# Patient Record
Sex: Male | Born: 1975 | Race: White | Hispanic: No | Marital: Single | Smoking: Current every day smoker
Health system: Southern US, Community
[De-identification: ages and names within clinical notes are randomized; demographics above are authoritative.]

## PROBLEM LIST (undated history)

## (undated) DIAGNOSIS — N200 Calculus of kidney: Secondary | ICD-10-CM

## (undated) DIAGNOSIS — F1990 Other psychoactive substance use, unspecified, uncomplicated: Secondary | ICD-10-CM

## (undated) DIAGNOSIS — M5136 Other intervertebral disc degeneration, lumbar region: Secondary | ICD-10-CM

## (undated) DIAGNOSIS — I82409 Acute embolism and thrombosis of unspecified deep veins of unspecified lower extremity: Secondary | ICD-10-CM

## (undated) DIAGNOSIS — E119 Type 2 diabetes mellitus without complications: Secondary | ICD-10-CM

## (undated) DIAGNOSIS — J449 Chronic obstructive pulmonary disease, unspecified: Secondary | ICD-10-CM

## (undated) DIAGNOSIS — M51369 Other intervertebral disc degeneration, lumbar region without mention of lumbar back pain or lower extremity pain: Secondary | ICD-10-CM

## (undated) DIAGNOSIS — I10 Essential (primary) hypertension: Secondary | ICD-10-CM

## (undated) HISTORY — PX: CHOLECYSTECTOMY: SHX55

## (undated) HISTORY — DX: Chronic obstructive pulmonary disease, unspecified: J44.9

## (undated) HISTORY — PX: BACK SURGERY: SHX140

## (undated) HISTORY — PX: GALLBLADDER SURGERY: SHX652

---

## 1998-10-30 ENCOUNTER — Emergency Department (HOSPITAL_COMMUNITY): Admission: EM | Admit: 1998-10-30 | Discharge: 1998-10-30 | Payer: Self-pay | Admitting: Emergency Medicine

## 2008-01-14 ENCOUNTER — Encounter: Admission: RE | Admit: 2008-01-14 | Discharge: 2008-01-14 | Payer: Self-pay | Admitting: Orthopaedic Surgery

## 2008-01-24 HISTORY — PX: KIDNEY SURGERY: SHX687

## 2010-02-14 ENCOUNTER — Encounter: Payer: Self-pay | Admitting: Orthopaedic Surgery

## 2010-10-11 ENCOUNTER — Other Ambulatory Visit: Payer: Self-pay

## 2010-10-11 ENCOUNTER — Other Ambulatory Visit: Payer: Self-pay | Admitting: Neurology

## 2010-10-11 DIAGNOSIS — M4306 Spondylolysis, lumbar region: Secondary | ICD-10-CM

## 2010-10-11 DIAGNOSIS — M5416 Radiculopathy, lumbar region: Secondary | ICD-10-CM

## 2010-10-14 ENCOUNTER — Ambulatory Visit
Admission: RE | Admit: 2010-10-14 | Discharge: 2010-10-14 | Disposition: A | Discharge status: Home / Self Care | Payer: Medicare Other | Source: Ambulatory Visit | Attending: Neurology | Admitting: Neurology

## 2010-10-14 DIAGNOSIS — M4306 Spondylolysis, lumbar region: Secondary | ICD-10-CM

## 2010-10-14 DIAGNOSIS — M5416 Radiculopathy, lumbar region: Secondary | ICD-10-CM

## 2011-01-26 DIAGNOSIS — M79609 Pain in unspecified limb: Secondary | ICD-10-CM | POA: Diagnosis not present

## 2011-01-26 DIAGNOSIS — B009 Herpesviral infection, unspecified: Secondary | ICD-10-CM | POA: Diagnosis not present

## 2011-01-26 DIAGNOSIS — G4700 Insomnia, unspecified: Secondary | ICD-10-CM | POA: Diagnosis not present

## 2011-01-31 DIAGNOSIS — G4700 Insomnia, unspecified: Secondary | ICD-10-CM | POA: Diagnosis not present

## 2011-01-31 DIAGNOSIS — E669 Obesity, unspecified: Secondary | ICD-10-CM | POA: Diagnosis not present

## 2011-01-31 DIAGNOSIS — M545 Low back pain: Secondary | ICD-10-CM | POA: Diagnosis not present

## 2011-02-06 DIAGNOSIS — G4700 Insomnia, unspecified: Secondary | ICD-10-CM | POA: Diagnosis not present

## 2011-02-06 DIAGNOSIS — M79609 Pain in unspecified limb: Secondary | ICD-10-CM | POA: Diagnosis not present

## 2011-02-06 DIAGNOSIS — L0390 Cellulitis, unspecified: Secondary | ICD-10-CM | POA: Diagnosis not present

## 2011-02-06 DIAGNOSIS — L0291 Cutaneous abscess, unspecified: Secondary | ICD-10-CM | POA: Diagnosis not present

## 2011-02-06 DIAGNOSIS — R609 Edema, unspecified: Secondary | ICD-10-CM | POA: Diagnosis not present

## 2011-02-13 DIAGNOSIS — G4700 Insomnia, unspecified: Secondary | ICD-10-CM | POA: Diagnosis not present

## 2011-02-13 DIAGNOSIS — E669 Obesity, unspecified: Secondary | ICD-10-CM | POA: Diagnosis not present

## 2011-02-13 DIAGNOSIS — M545 Low back pain: Secondary | ICD-10-CM | POA: Diagnosis not present

## 2011-02-22 DIAGNOSIS — I10 Essential (primary) hypertension: Secondary | ICD-10-CM | POA: Diagnosis not present

## 2011-02-22 DIAGNOSIS — M545 Low back pain: Secondary | ICD-10-CM | POA: Diagnosis not present

## 2011-02-22 DIAGNOSIS — R609 Edema, unspecified: Secondary | ICD-10-CM | POA: Diagnosis not present

## 2011-02-22 DIAGNOSIS — G4700 Insomnia, unspecified: Secondary | ICD-10-CM | POA: Diagnosis not present

## 2011-02-23 DIAGNOSIS — F332 Major depressive disorder, recurrent severe without psychotic features: Secondary | ICD-10-CM | POA: Diagnosis not present

## 2011-02-27 DIAGNOSIS — I10 Essential (primary) hypertension: Secondary | ICD-10-CM | POA: Diagnosis not present

## 2011-02-27 DIAGNOSIS — G4700 Insomnia, unspecified: Secondary | ICD-10-CM | POA: Diagnosis not present

## 2011-02-27 DIAGNOSIS — R609 Edema, unspecified: Secondary | ICD-10-CM | POA: Diagnosis not present

## 2011-02-27 DIAGNOSIS — M545 Low back pain: Secondary | ICD-10-CM | POA: Diagnosis not present

## 2011-03-07 DIAGNOSIS — E039 Hypothyroidism, unspecified: Secondary | ICD-10-CM | POA: Diagnosis not present

## 2011-03-07 DIAGNOSIS — R5383 Other fatigue: Secondary | ICD-10-CM | POA: Diagnosis not present

## 2011-03-07 DIAGNOSIS — E7800 Pure hypercholesterolemia, unspecified: Secondary | ICD-10-CM | POA: Diagnosis not present

## 2011-03-07 DIAGNOSIS — Z5181 Encounter for therapeutic drug level monitoring: Secondary | ICD-10-CM | POA: Diagnosis not present

## 2011-03-07 DIAGNOSIS — Z79899 Other long term (current) drug therapy: Secondary | ICD-10-CM | POA: Diagnosis not present

## 2011-03-07 DIAGNOSIS — R5381 Other malaise: Secondary | ICD-10-CM | POA: Diagnosis not present

## 2011-03-07 DIAGNOSIS — E119 Type 2 diabetes mellitus without complications: Secondary | ICD-10-CM | POA: Diagnosis not present

## 2011-03-09 DIAGNOSIS — I10 Essential (primary) hypertension: Secondary | ICD-10-CM | POA: Diagnosis not present

## 2011-03-09 DIAGNOSIS — G4700 Insomnia, unspecified: Secondary | ICD-10-CM | POA: Diagnosis not present

## 2011-03-09 DIAGNOSIS — M545 Low back pain: Secondary | ICD-10-CM | POA: Diagnosis not present

## 2011-03-10 DIAGNOSIS — M47817 Spondylosis without myelopathy or radiculopathy, lumbosacral region: Secondary | ICD-10-CM | POA: Diagnosis not present

## 2011-03-10 DIAGNOSIS — IMO0002 Reserved for concepts with insufficient information to code with codable children: Secondary | ICD-10-CM | POA: Diagnosis not present

## 2011-03-13 DIAGNOSIS — E785 Hyperlipidemia, unspecified: Secondary | ICD-10-CM | POA: Diagnosis not present

## 2011-03-13 DIAGNOSIS — G4700 Insomnia, unspecified: Secondary | ICD-10-CM | POA: Diagnosis not present

## 2011-03-13 DIAGNOSIS — E119 Type 2 diabetes mellitus without complications: Secondary | ICD-10-CM | POA: Diagnosis not present

## 2011-03-13 DIAGNOSIS — Z79899 Other long term (current) drug therapy: Secondary | ICD-10-CM | POA: Diagnosis not present

## 2011-03-13 DIAGNOSIS — Z5181 Encounter for therapeutic drug level monitoring: Secondary | ICD-10-CM | POA: Diagnosis not present

## 2011-03-13 DIAGNOSIS — M545 Low back pain: Secondary | ICD-10-CM | POA: Diagnosis not present

## 2011-03-14 DIAGNOSIS — F339 Major depressive disorder, recurrent, unspecified: Secondary | ICD-10-CM | POA: Diagnosis not present

## 2011-04-11 DIAGNOSIS — M545 Low back pain: Secondary | ICD-10-CM | POA: Diagnosis not present

## 2011-04-11 DIAGNOSIS — R5381 Other malaise: Secondary | ICD-10-CM | POA: Diagnosis not present

## 2011-04-11 DIAGNOSIS — G4700 Insomnia, unspecified: Secondary | ICD-10-CM | POA: Diagnosis not present

## 2011-04-11 DIAGNOSIS — IMO0001 Reserved for inherently not codable concepts without codable children: Secondary | ICD-10-CM | POA: Diagnosis not present

## 2011-04-28 DIAGNOSIS — R5381 Other malaise: Secondary | ICD-10-CM | POA: Diagnosis not present

## 2011-04-28 DIAGNOSIS — G4700 Insomnia, unspecified: Secondary | ICD-10-CM | POA: Diagnosis not present

## 2011-04-28 DIAGNOSIS — M545 Low back pain: Secondary | ICD-10-CM | POA: Diagnosis not present

## 2011-05-05 DIAGNOSIS — M545 Low back pain, unspecified: Secondary | ICD-10-CM | POA: Diagnosis not present

## 2011-05-05 DIAGNOSIS — I10 Essential (primary) hypertension: Secondary | ICD-10-CM | POA: Diagnosis not present

## 2011-05-05 DIAGNOSIS — M5430 Sciatica, unspecified side: Secondary | ICD-10-CM | POA: Diagnosis not present

## 2011-05-07 DIAGNOSIS — R52 Pain, unspecified: Secondary | ICD-10-CM | POA: Diagnosis not present

## 2011-05-07 DIAGNOSIS — M545 Low back pain, unspecified: Secondary | ICD-10-CM | POA: Diagnosis not present

## 2011-05-07 DIAGNOSIS — I10 Essential (primary) hypertension: Secondary | ICD-10-CM | POA: Diagnosis not present

## 2011-05-07 DIAGNOSIS — G8929 Other chronic pain: Secondary | ICD-10-CM | POA: Diagnosis not present

## 2011-05-07 DIAGNOSIS — E119 Type 2 diabetes mellitus without complications: Secondary | ICD-10-CM | POA: Diagnosis not present

## 2011-05-07 DIAGNOSIS — F17200 Nicotine dependence, unspecified, uncomplicated: Secondary | ICD-10-CM | POA: Diagnosis not present

## 2011-05-09 DIAGNOSIS — I10 Essential (primary) hypertension: Secondary | ICD-10-CM | POA: Diagnosis not present

## 2011-05-09 DIAGNOSIS — M545 Low back pain: Secondary | ICD-10-CM | POA: Diagnosis not present

## 2011-05-09 DIAGNOSIS — M79609 Pain in unspecified limb: Secondary | ICD-10-CM | POA: Diagnosis not present

## 2011-05-15 DIAGNOSIS — M9981 Other biomechanical lesions of cervical region: Secondary | ICD-10-CM | POA: Diagnosis not present

## 2011-05-15 DIAGNOSIS — IMO0002 Reserved for concepts with insufficient information to code with codable children: Secondary | ICD-10-CM | POA: Diagnosis not present

## 2011-05-15 DIAGNOSIS — M999 Biomechanical lesion, unspecified: Secondary | ICD-10-CM | POA: Diagnosis not present

## 2011-05-16 DIAGNOSIS — M9981 Other biomechanical lesions of cervical region: Secondary | ICD-10-CM | POA: Diagnosis not present

## 2011-05-16 DIAGNOSIS — M999 Biomechanical lesion, unspecified: Secondary | ICD-10-CM | POA: Diagnosis not present

## 2011-05-16 DIAGNOSIS — IMO0002 Reserved for concepts with insufficient information to code with codable children: Secondary | ICD-10-CM | POA: Diagnosis not present

## 2011-05-17 DIAGNOSIS — Z79899 Other long term (current) drug therapy: Secondary | ICD-10-CM | POA: Diagnosis not present

## 2011-05-17 DIAGNOSIS — R109 Unspecified abdominal pain: Secondary | ICD-10-CM | POA: Diagnosis not present

## 2011-05-17 DIAGNOSIS — F17200 Nicotine dependence, unspecified, uncomplicated: Secondary | ICD-10-CM | POA: Diagnosis not present

## 2011-05-17 DIAGNOSIS — K219 Gastro-esophageal reflux disease without esophagitis: Secondary | ICD-10-CM | POA: Diagnosis not present

## 2011-05-17 DIAGNOSIS — I10 Essential (primary) hypertension: Secondary | ICD-10-CM | POA: Diagnosis not present

## 2011-05-17 DIAGNOSIS — E7800 Pure hypercholesterolemia, unspecified: Secondary | ICD-10-CM | POA: Diagnosis not present

## 2011-05-17 DIAGNOSIS — F329 Major depressive disorder, single episode, unspecified: Secondary | ICD-10-CM | POA: Diagnosis not present

## 2011-05-17 DIAGNOSIS — N201 Calculus of ureter: Secondary | ICD-10-CM | POA: Diagnosis not present

## 2011-05-17 DIAGNOSIS — N209 Urinary calculus, unspecified: Secondary | ICD-10-CM | POA: Diagnosis not present

## 2011-05-18 DIAGNOSIS — IMO0002 Reserved for concepts with insufficient information to code with codable children: Secondary | ICD-10-CM | POA: Diagnosis not present

## 2011-05-18 DIAGNOSIS — M999 Biomechanical lesion, unspecified: Secondary | ICD-10-CM | POA: Diagnosis not present

## 2011-05-18 DIAGNOSIS — M9981 Other biomechanical lesions of cervical region: Secondary | ICD-10-CM | POA: Diagnosis not present

## 2011-05-19 DIAGNOSIS — N200 Calculus of kidney: Secondary | ICD-10-CM | POA: Diagnosis not present

## 2011-05-19 DIAGNOSIS — G4700 Insomnia, unspecified: Secondary | ICD-10-CM | POA: Diagnosis not present

## 2011-05-23 DIAGNOSIS — R5381 Other malaise: Secondary | ICD-10-CM | POA: Diagnosis not present

## 2011-05-23 DIAGNOSIS — R5383 Other fatigue: Secondary | ICD-10-CM | POA: Diagnosis not present

## 2011-05-23 DIAGNOSIS — G4700 Insomnia, unspecified: Secondary | ICD-10-CM | POA: Diagnosis not present

## 2011-05-23 DIAGNOSIS — M79609 Pain in unspecified limb: Secondary | ICD-10-CM | POA: Diagnosis not present

## 2011-05-25 DIAGNOSIS — M999 Biomechanical lesion, unspecified: Secondary | ICD-10-CM | POA: Diagnosis not present

## 2011-05-25 DIAGNOSIS — IMO0002 Reserved for concepts with insufficient information to code with codable children: Secondary | ICD-10-CM | POA: Diagnosis not present

## 2011-05-25 DIAGNOSIS — M9981 Other biomechanical lesions of cervical region: Secondary | ICD-10-CM | POA: Diagnosis not present

## 2011-05-26 DIAGNOSIS — Z5181 Encounter for therapeutic drug level monitoring: Secondary | ICD-10-CM | POA: Diagnosis not present

## 2011-05-26 DIAGNOSIS — Z79899 Other long term (current) drug therapy: Secondary | ICD-10-CM | POA: Diagnosis not present

## 2011-05-26 DIAGNOSIS — G4700 Insomnia, unspecified: Secondary | ICD-10-CM | POA: Diagnosis not present

## 2011-05-26 DIAGNOSIS — M545 Low back pain: Secondary | ICD-10-CM | POA: Diagnosis not present

## 2011-05-30 DIAGNOSIS — M999 Biomechanical lesion, unspecified: Secondary | ICD-10-CM | POA: Diagnosis not present

## 2011-05-30 DIAGNOSIS — M9981 Other biomechanical lesions of cervical region: Secondary | ICD-10-CM | POA: Diagnosis not present

## 2011-05-30 DIAGNOSIS — IMO0002 Reserved for concepts with insufficient information to code with codable children: Secondary | ICD-10-CM | POA: Diagnosis not present

## 2011-06-06 DIAGNOSIS — M9981 Other biomechanical lesions of cervical region: Secondary | ICD-10-CM | POA: Diagnosis not present

## 2011-06-06 DIAGNOSIS — IMO0002 Reserved for concepts with insufficient information to code with codable children: Secondary | ICD-10-CM | POA: Diagnosis not present

## 2011-06-06 DIAGNOSIS — M999 Biomechanical lesion, unspecified: Secondary | ICD-10-CM | POA: Diagnosis not present

## 2011-06-10 DIAGNOSIS — M9981 Other biomechanical lesions of cervical region: Secondary | ICD-10-CM | POA: Diagnosis not present

## 2011-06-10 DIAGNOSIS — M999 Biomechanical lesion, unspecified: Secondary | ICD-10-CM | POA: Diagnosis not present

## 2011-06-10 DIAGNOSIS — IMO0002 Reserved for concepts with insufficient information to code with codable children: Secondary | ICD-10-CM | POA: Diagnosis not present

## 2011-06-15 DIAGNOSIS — M9981 Other biomechanical lesions of cervical region: Secondary | ICD-10-CM | POA: Diagnosis not present

## 2011-06-15 DIAGNOSIS — IMO0002 Reserved for concepts with insufficient information to code with codable children: Secondary | ICD-10-CM | POA: Diagnosis not present

## 2011-06-15 DIAGNOSIS — M999 Biomechanical lesion, unspecified: Secondary | ICD-10-CM | POA: Diagnosis not present

## 2011-06-22 DIAGNOSIS — F259 Schizoaffective disorder, unspecified: Secondary | ICD-10-CM | POA: Diagnosis not present

## 2011-06-22 DIAGNOSIS — F332 Major depressive disorder, recurrent severe without psychotic features: Secondary | ICD-10-CM | POA: Diagnosis not present

## 2011-06-28 DIAGNOSIS — J209 Acute bronchitis, unspecified: Secondary | ICD-10-CM | POA: Diagnosis not present

## 2011-06-28 DIAGNOSIS — N23 Unspecified renal colic: Secondary | ICD-10-CM | POA: Diagnosis not present

## 2011-06-28 DIAGNOSIS — R319 Hematuria, unspecified: Secondary | ICD-10-CM | POA: Diagnosis not present

## 2011-07-04 DIAGNOSIS — J209 Acute bronchitis, unspecified: Secondary | ICD-10-CM | POA: Diagnosis not present

## 2011-07-05 DIAGNOSIS — R5383 Other fatigue: Secondary | ICD-10-CM | POA: Diagnosis not present

## 2011-07-31 DIAGNOSIS — E785 Hyperlipidemia, unspecified: Secondary | ICD-10-CM | POA: Diagnosis not present

## 2011-07-31 DIAGNOSIS — J45909 Unspecified asthma, uncomplicated: Secondary | ICD-10-CM | POA: Diagnosis not present

## 2011-07-31 DIAGNOSIS — F332 Major depressive disorder, recurrent severe without psychotic features: Secondary | ICD-10-CM | POA: Diagnosis not present

## 2011-07-31 DIAGNOSIS — I10 Essential (primary) hypertension: Secondary | ICD-10-CM | POA: Diagnosis not present

## 2011-07-31 DIAGNOSIS — F259 Schizoaffective disorder, unspecified: Secondary | ICD-10-CM | POA: Diagnosis not present

## 2011-08-03 DIAGNOSIS — E7800 Pure hypercholesterolemia, unspecified: Secondary | ICD-10-CM | POA: Diagnosis not present

## 2011-08-03 DIAGNOSIS — I10 Essential (primary) hypertension: Secondary | ICD-10-CM | POA: Diagnosis not present

## 2011-09-04 DIAGNOSIS — Z79899 Other long term (current) drug therapy: Secondary | ICD-10-CM | POA: Diagnosis not present

## 2011-09-11 DIAGNOSIS — Z79899 Other long term (current) drug therapy: Secondary | ICD-10-CM | POA: Diagnosis not present

## 2011-09-11 DIAGNOSIS — F339 Major depressive disorder, recurrent, unspecified: Secondary | ICD-10-CM | POA: Diagnosis not present

## 2011-09-13 DIAGNOSIS — D72829 Elevated white blood cell count, unspecified: Secondary | ICD-10-CM | POA: Diagnosis not present

## 2011-09-29 DIAGNOSIS — Z79899 Other long term (current) drug therapy: Secondary | ICD-10-CM | POA: Diagnosis not present

## 2011-10-13 DIAGNOSIS — G4700 Insomnia, unspecified: Secondary | ICD-10-CM | POA: Diagnosis not present

## 2011-10-13 DIAGNOSIS — Z79899 Other long term (current) drug therapy: Secondary | ICD-10-CM | POA: Diagnosis not present

## 2011-10-13 DIAGNOSIS — Z6841 Body Mass Index (BMI) 40.0 and over, adult: Secondary | ICD-10-CM | POA: Diagnosis not present

## 2011-10-14 DIAGNOSIS — N23 Unspecified renal colic: Secondary | ICD-10-CM | POA: Diagnosis not present

## 2011-11-10 DIAGNOSIS — G4700 Insomnia, unspecified: Secondary | ICD-10-CM | POA: Diagnosis not present

## 2011-11-10 DIAGNOSIS — Z6841 Body Mass Index (BMI) 40.0 and over, adult: Secondary | ICD-10-CM | POA: Diagnosis not present

## 2011-11-10 DIAGNOSIS — Z79899 Other long term (current) drug therapy: Secondary | ICD-10-CM | POA: Diagnosis not present

## 2011-11-10 DIAGNOSIS — IMO0002 Reserved for concepts with insufficient information to code with codable children: Secondary | ICD-10-CM | POA: Diagnosis not present

## 2011-11-24 DIAGNOSIS — E119 Type 2 diabetes mellitus without complications: Secondary | ICD-10-CM | POA: Diagnosis not present

## 2011-11-24 DIAGNOSIS — I10 Essential (primary) hypertension: Secondary | ICD-10-CM | POA: Diagnosis not present

## 2011-11-24 DIAGNOSIS — R0789 Other chest pain: Secondary | ICD-10-CM | POA: Diagnosis not present

## 2011-11-24 DIAGNOSIS — M545 Low back pain: Secondary | ICD-10-CM | POA: Diagnosis not present

## 2011-11-24 DIAGNOSIS — Z79899 Other long term (current) drug therapy: Secondary | ICD-10-CM | POA: Diagnosis not present

## 2011-12-25 DIAGNOSIS — E7800 Pure hypercholesterolemia, unspecified: Secondary | ICD-10-CM | POA: Diagnosis not present

## 2011-12-25 DIAGNOSIS — Z79899 Other long term (current) drug therapy: Secondary | ICD-10-CM | POA: Diagnosis not present

## 2011-12-25 DIAGNOSIS — M545 Low back pain: Secondary | ICD-10-CM | POA: Diagnosis not present

## 2011-12-25 DIAGNOSIS — J069 Acute upper respiratory infection, unspecified: Secondary | ICD-10-CM | POA: Diagnosis not present

## 2011-12-25 DIAGNOSIS — I10 Essential (primary) hypertension: Secondary | ICD-10-CM | POA: Diagnosis not present

## 2011-12-26 DIAGNOSIS — M47817 Spondylosis without myelopathy or radiculopathy, lumbosacral region: Secondary | ICD-10-CM | POA: Diagnosis not present

## 2011-12-26 DIAGNOSIS — M5126 Other intervertebral disc displacement, lumbar region: Secondary | ICD-10-CM | POA: Diagnosis not present

## 2011-12-26 DIAGNOSIS — Q7649 Other congenital malformations of spine, not associated with scoliosis: Secondary | ICD-10-CM | POA: Diagnosis not present

## 2011-12-26 DIAGNOSIS — G8929 Other chronic pain: Secondary | ICD-10-CM | POA: Diagnosis not present

## 2011-12-30 DIAGNOSIS — M47817 Spondylosis without myelopathy or radiculopathy, lumbosacral region: Secondary | ICD-10-CM | POA: Diagnosis not present

## 2011-12-30 DIAGNOSIS — D72829 Elevated white blood cell count, unspecified: Secondary | ICD-10-CM | POA: Diagnosis not present

## 2011-12-30 DIAGNOSIS — F319 Bipolar disorder, unspecified: Secondary | ICD-10-CM | POA: Diagnosis not present

## 2011-12-30 DIAGNOSIS — R002 Palpitations: Secondary | ICD-10-CM | POA: Diagnosis not present

## 2011-12-30 DIAGNOSIS — R109 Unspecified abdominal pain: Secondary | ICD-10-CM | POA: Diagnosis not present

## 2011-12-30 DIAGNOSIS — R079 Chest pain, unspecified: Secondary | ICD-10-CM | POA: Diagnosis not present

## 2011-12-30 DIAGNOSIS — E119 Type 2 diabetes mellitus without complications: Secondary | ICD-10-CM | POA: Diagnosis not present

## 2011-12-30 DIAGNOSIS — E785 Hyperlipidemia, unspecified: Secondary | ICD-10-CM | POA: Diagnosis not present

## 2011-12-30 DIAGNOSIS — R0789 Other chest pain: Secondary | ICD-10-CM | POA: Diagnosis not present

## 2011-12-30 DIAGNOSIS — I10 Essential (primary) hypertension: Secondary | ICD-10-CM | POA: Diagnosis not present

## 2011-12-30 DIAGNOSIS — R0602 Shortness of breath: Secondary | ICD-10-CM | POA: Diagnosis not present

## 2011-12-31 DIAGNOSIS — R079 Chest pain, unspecified: Secondary | ICD-10-CM | POA: Diagnosis not present

## 2011-12-31 DIAGNOSIS — Z7982 Long term (current) use of aspirin: Secondary | ICD-10-CM | POA: Diagnosis not present

## 2011-12-31 DIAGNOSIS — R002 Palpitations: Secondary | ICD-10-CM | POA: Diagnosis not present

## 2011-12-31 DIAGNOSIS — Z23 Encounter for immunization: Secondary | ICD-10-CM | POA: Diagnosis not present

## 2011-12-31 DIAGNOSIS — M47817 Spondylosis without myelopathy or radiculopathy, lumbosacral region: Secondary | ICD-10-CM | POA: Diagnosis not present

## 2011-12-31 DIAGNOSIS — I130 Hypertensive heart and chronic kidney disease with heart failure and stage 1 through stage 4 chronic kidney disease, or unspecified chronic kidney disease: Secondary | ICD-10-CM | POA: Diagnosis not present

## 2011-12-31 DIAGNOSIS — M159 Polyosteoarthritis, unspecified: Secondary | ICD-10-CM | POA: Diagnosis present

## 2011-12-31 DIAGNOSIS — R109 Unspecified abdominal pain: Secondary | ICD-10-CM | POA: Diagnosis not present

## 2011-12-31 DIAGNOSIS — G4440 Drug-induced headache, not elsewhere classified, not intractable: Secondary | ICD-10-CM | POA: Diagnosis not present

## 2011-12-31 DIAGNOSIS — Z79899 Other long term (current) drug therapy: Secondary | ICD-10-CM | POA: Diagnosis not present

## 2011-12-31 DIAGNOSIS — D72829 Elevated white blood cell count, unspecified: Secondary | ICD-10-CM | POA: Diagnosis not present

## 2011-12-31 DIAGNOSIS — E119 Type 2 diabetes mellitus without complications: Secondary | ICD-10-CM | POA: Diagnosis not present

## 2011-12-31 DIAGNOSIS — Z0389 Encounter for observation for other suspected diseases and conditions ruled out: Secondary | ICD-10-CM | POA: Diagnosis not present

## 2011-12-31 DIAGNOSIS — E1159 Type 2 diabetes mellitus with other circulatory complications: Secondary | ICD-10-CM | POA: Diagnosis not present

## 2011-12-31 DIAGNOSIS — E785 Hyperlipidemia, unspecified: Secondary | ICD-10-CM | POA: Diagnosis not present

## 2011-12-31 DIAGNOSIS — I10 Essential (primary) hypertension: Secondary | ICD-10-CM | POA: Diagnosis not present

## 2011-12-31 DIAGNOSIS — F319 Bipolar disorder, unspecified: Secondary | ICD-10-CM | POA: Diagnosis not present

## 2011-12-31 DIAGNOSIS — R0602 Shortness of breath: Secondary | ICD-10-CM | POA: Diagnosis not present

## 2011-12-31 DIAGNOSIS — F17200 Nicotine dependence, unspecified, uncomplicated: Secondary | ICD-10-CM | POA: Diagnosis present

## 2011-12-31 DIAGNOSIS — R0789 Other chest pain: Secondary | ICD-10-CM | POA: Diagnosis not present

## 2012-01-01 DIAGNOSIS — E785 Hyperlipidemia, unspecified: Secondary | ICD-10-CM | POA: Diagnosis not present

## 2012-01-01 DIAGNOSIS — E1159 Type 2 diabetes mellitus with other circulatory complications: Secondary | ICD-10-CM | POA: Diagnosis not present

## 2012-01-01 DIAGNOSIS — I130 Hypertensive heart and chronic kidney disease with heart failure and stage 1 through stage 4 chronic kidney disease, or unspecified chronic kidney disease: Secondary | ICD-10-CM | POA: Diagnosis not present

## 2012-01-01 DIAGNOSIS — R079 Chest pain, unspecified: Secondary | ICD-10-CM | POA: Diagnosis not present

## 2012-01-08 DIAGNOSIS — L03119 Cellulitis of unspecified part of limb: Secondary | ICD-10-CM | POA: Diagnosis not present

## 2012-01-08 DIAGNOSIS — R072 Precordial pain: Secondary | ICD-10-CM | POA: Diagnosis not present

## 2012-01-08 DIAGNOSIS — L02519 Cutaneous abscess of unspecified hand: Secondary | ICD-10-CM | POA: Diagnosis not present

## 2012-01-11 DIAGNOSIS — R1013 Epigastric pain: Secondary | ICD-10-CM | POA: Diagnosis not present

## 2012-01-22 DIAGNOSIS — K828 Other specified diseases of gallbladder: Secondary | ICD-10-CM | POA: Diagnosis not present

## 2012-01-22 DIAGNOSIS — R110 Nausea: Secondary | ICD-10-CM | POA: Diagnosis not present

## 2012-01-22 DIAGNOSIS — R1011 Right upper quadrant pain: Secondary | ICD-10-CM | POA: Diagnosis not present

## 2012-01-25 DIAGNOSIS — I10 Essential (primary) hypertension: Secondary | ICD-10-CM | POA: Diagnosis not present

## 2012-01-25 DIAGNOSIS — M545 Low back pain: Secondary | ICD-10-CM | POA: Diagnosis not present

## 2012-01-25 DIAGNOSIS — K828 Other specified diseases of gallbladder: Secondary | ICD-10-CM | POA: Diagnosis not present

## 2012-01-31 DIAGNOSIS — K811 Chronic cholecystitis: Secondary | ICD-10-CM | POA: Diagnosis not present

## 2012-01-31 DIAGNOSIS — E119 Type 2 diabetes mellitus without complications: Secondary | ICD-10-CM | POA: Diagnosis not present

## 2012-01-31 DIAGNOSIS — I10 Essential (primary) hypertension: Secondary | ICD-10-CM | POA: Diagnosis not present

## 2012-01-31 DIAGNOSIS — Z79899 Other long term (current) drug therapy: Secondary | ICD-10-CM | POA: Diagnosis not present

## 2012-01-31 DIAGNOSIS — E669 Obesity, unspecified: Secondary | ICD-10-CM | POA: Diagnosis not present

## 2012-01-31 DIAGNOSIS — K828 Other specified diseases of gallbladder: Secondary | ICD-10-CM | POA: Diagnosis not present

## 2012-01-31 DIAGNOSIS — F17200 Nicotine dependence, unspecified, uncomplicated: Secondary | ICD-10-CM | POA: Diagnosis not present

## 2012-02-02 DIAGNOSIS — I10 Essential (primary) hypertension: Secondary | ICD-10-CM | POA: Diagnosis not present

## 2012-02-14 DIAGNOSIS — R197 Diarrhea, unspecified: Secondary | ICD-10-CM | POA: Diagnosis not present

## 2012-02-14 DIAGNOSIS — R1011 Right upper quadrant pain: Secondary | ICD-10-CM | POA: Diagnosis not present

## 2012-02-14 DIAGNOSIS — R1031 Right lower quadrant pain: Secondary | ICD-10-CM | POA: Diagnosis not present

## 2012-02-14 DIAGNOSIS — J209 Acute bronchitis, unspecified: Secondary | ICD-10-CM | POA: Diagnosis not present

## 2012-02-16 DIAGNOSIS — R197 Diarrhea, unspecified: Secondary | ICD-10-CM | POA: Diagnosis not present

## 2012-02-23 DIAGNOSIS — M545 Low back pain, unspecified: Secondary | ICD-10-CM | POA: Diagnosis not present

## 2012-02-23 DIAGNOSIS — M51379 Other intervertebral disc degeneration, lumbosacral region without mention of lumbar back pain or lower extremity pain: Secondary | ICD-10-CM | POA: Diagnosis not present

## 2012-02-23 DIAGNOSIS — M5137 Other intervertebral disc degeneration, lumbosacral region: Secondary | ICD-10-CM | POA: Diagnosis not present

## 2012-02-23 DIAGNOSIS — M48062 Spinal stenosis, lumbar region with neurogenic claudication: Secondary | ICD-10-CM | POA: Diagnosis not present

## 2012-03-04 DIAGNOSIS — R1011 Right upper quadrant pain: Secondary | ICD-10-CM | POA: Diagnosis not present

## 2012-03-04 DIAGNOSIS — R1031 Right lower quadrant pain: Secondary | ICD-10-CM | POA: Diagnosis not present

## 2012-03-04 DIAGNOSIS — R197 Diarrhea, unspecified: Secondary | ICD-10-CM | POA: Diagnosis not present

## 2012-03-04 DIAGNOSIS — R112 Nausea with vomiting, unspecified: Secondary | ICD-10-CM | POA: Diagnosis not present

## 2012-03-05 DIAGNOSIS — F259 Schizoaffective disorder, unspecified: Secondary | ICD-10-CM | POA: Diagnosis not present

## 2012-03-06 DIAGNOSIS — R059 Cough, unspecified: Secondary | ICD-10-CM | POA: Diagnosis not present

## 2012-03-06 DIAGNOSIS — R05 Cough: Secondary | ICD-10-CM | POA: Diagnosis not present

## 2012-03-06 DIAGNOSIS — R1011 Right upper quadrant pain: Secondary | ICD-10-CM | POA: Diagnosis not present

## 2012-03-14 DIAGNOSIS — I7090 Unspecified atherosclerosis: Secondary | ICD-10-CM | POA: Diagnosis not present

## 2012-03-14 DIAGNOSIS — M5137 Other intervertebral disc degeneration, lumbosacral region: Secondary | ICD-10-CM | POA: Diagnosis not present

## 2012-03-14 DIAGNOSIS — K5730 Diverticulosis of large intestine without perforation or abscess without bleeding: Secondary | ICD-10-CM | POA: Diagnosis not present

## 2012-03-14 DIAGNOSIS — R1011 Right upper quadrant pain: Secondary | ICD-10-CM | POA: Diagnosis not present

## 2012-05-27 DIAGNOSIS — F339 Major depressive disorder, recurrent, unspecified: Secondary | ICD-10-CM | POA: Diagnosis not present

## 2012-05-29 DIAGNOSIS — M5126 Other intervertebral disc displacement, lumbar region: Secondary | ICD-10-CM | POA: Diagnosis not present

## 2012-06-03 DIAGNOSIS — E119 Type 2 diabetes mellitus without complications: Secondary | ICD-10-CM | POA: Diagnosis not present

## 2012-06-03 DIAGNOSIS — M545 Low back pain: Secondary | ICD-10-CM | POA: Diagnosis not present

## 2012-06-03 DIAGNOSIS — F17200 Nicotine dependence, unspecified, uncomplicated: Secondary | ICD-10-CM | POA: Diagnosis not present

## 2012-06-03 DIAGNOSIS — I10 Essential (primary) hypertension: Secondary | ICD-10-CM | POA: Diagnosis not present

## 2012-06-03 DIAGNOSIS — E7800 Pure hypercholesterolemia, unspecified: Secondary | ICD-10-CM | POA: Diagnosis not present

## 2012-06-20 DIAGNOSIS — L02439 Carbuncle of limb, unspecified: Secondary | ICD-10-CM | POA: Diagnosis not present

## 2012-06-20 DIAGNOSIS — L03818 Cellulitis of other sites: Secondary | ICD-10-CM | POA: Diagnosis not present

## 2012-06-20 DIAGNOSIS — L02818 Cutaneous abscess of other sites: Secondary | ICD-10-CM | POA: Diagnosis not present

## 2012-07-02 DIAGNOSIS — I119 Hypertensive heart disease without heart failure: Secondary | ICD-10-CM | POA: Diagnosis not present

## 2012-07-02 DIAGNOSIS — I10 Essential (primary) hypertension: Secondary | ICD-10-CM | POA: Diagnosis not present

## 2012-07-08 DIAGNOSIS — F329 Major depressive disorder, single episode, unspecified: Secondary | ICD-10-CM | POA: Diagnosis not present

## 2012-07-08 DIAGNOSIS — E785 Hyperlipidemia, unspecified: Secondary | ICD-10-CM | POA: Diagnosis not present

## 2012-07-08 DIAGNOSIS — E669 Obesity, unspecified: Secondary | ICD-10-CM | POA: Diagnosis not present

## 2012-07-08 DIAGNOSIS — M5126 Other intervertebral disc displacement, lumbar region: Secondary | ICD-10-CM | POA: Diagnosis not present

## 2012-07-08 DIAGNOSIS — J45909 Unspecified asthma, uncomplicated: Secondary | ICD-10-CM | POA: Diagnosis not present

## 2012-07-08 DIAGNOSIS — K3189 Other diseases of stomach and duodenum: Secondary | ICD-10-CM | POA: Diagnosis not present

## 2012-07-08 DIAGNOSIS — Z6838 Body mass index (BMI) 38.0-38.9, adult: Secondary | ICD-10-CM | POA: Diagnosis not present

## 2012-07-08 DIAGNOSIS — F17200 Nicotine dependence, unspecified, uncomplicated: Secondary | ICD-10-CM | POA: Diagnosis not present

## 2012-07-08 DIAGNOSIS — Z833 Family history of diabetes mellitus: Secondary | ICD-10-CM | POA: Diagnosis not present

## 2012-07-08 DIAGNOSIS — Z8249 Family history of ischemic heart disease and other diseases of the circulatory system: Secondary | ICD-10-CM | POA: Diagnosis not present

## 2012-07-08 DIAGNOSIS — I10 Essential (primary) hypertension: Secondary | ICD-10-CM | POA: Diagnosis not present

## 2012-07-08 DIAGNOSIS — M48061 Spinal stenosis, lumbar region without neurogenic claudication: Secondary | ICD-10-CM | POA: Diagnosis not present

## 2012-07-08 DIAGNOSIS — E119 Type 2 diabetes mellitus without complications: Secondary | ICD-10-CM | POA: Diagnosis not present

## 2012-07-17 DIAGNOSIS — R42 Dizziness and giddiness: Secondary | ICD-10-CM | POA: Diagnosis not present

## 2012-07-17 DIAGNOSIS — H6980 Other specified disorders of Eustachian tube, unspecified ear: Secondary | ICD-10-CM | POA: Diagnosis not present

## 2012-08-13 DIAGNOSIS — F17200 Nicotine dependence, unspecified, uncomplicated: Secondary | ICD-10-CM | POA: Diagnosis not present

## 2012-08-13 DIAGNOSIS — Z4789 Encounter for other orthopedic aftercare: Secondary | ICD-10-CM | POA: Diagnosis not present

## 2012-08-13 DIAGNOSIS — IMO0002 Reserved for concepts with insufficient information to code with codable children: Secondary | ICD-10-CM | POA: Diagnosis not present

## 2012-08-15 DIAGNOSIS — L03119 Cellulitis of unspecified part of limb: Secondary | ICD-10-CM | POA: Diagnosis not present

## 2012-08-20 DIAGNOSIS — J309 Allergic rhinitis, unspecified: Secondary | ICD-10-CM | POA: Diagnosis not present

## 2012-08-20 DIAGNOSIS — L0291 Cutaneous abscess, unspecified: Secondary | ICD-10-CM | POA: Diagnosis not present

## 2012-08-20 DIAGNOSIS — L0390 Cellulitis, unspecified: Secondary | ICD-10-CM | POA: Diagnosis not present

## 2012-08-23 DIAGNOSIS — L0291 Cutaneous abscess, unspecified: Secondary | ICD-10-CM | POA: Diagnosis not present

## 2012-08-23 DIAGNOSIS — E119 Type 2 diabetes mellitus without complications: Secondary | ICD-10-CM | POA: Diagnosis not present

## 2012-08-23 DIAGNOSIS — L0390 Cellulitis, unspecified: Secondary | ICD-10-CM | POA: Diagnosis not present

## 2012-08-28 DIAGNOSIS — F331 Major depressive disorder, recurrent, moderate: Secondary | ICD-10-CM | POA: Diagnosis not present

## 2012-09-02 DIAGNOSIS — F339 Major depressive disorder, recurrent, unspecified: Secondary | ICD-10-CM | POA: Diagnosis not present

## 2012-09-11 DIAGNOSIS — I10 Essential (primary) hypertension: Secondary | ICD-10-CM | POA: Diagnosis not present

## 2012-09-11 DIAGNOSIS — E7800 Pure hypercholesterolemia, unspecified: Secondary | ICD-10-CM | POA: Diagnosis not present

## 2012-09-11 DIAGNOSIS — F5102 Adjustment insomnia: Secondary | ICD-10-CM | POA: Diagnosis not present

## 2012-09-11 DIAGNOSIS — E119 Type 2 diabetes mellitus without complications: Secondary | ICD-10-CM | POA: Diagnosis not present

## 2012-09-11 DIAGNOSIS — L0291 Cutaneous abscess, unspecified: Secondary | ICD-10-CM | POA: Diagnosis not present

## 2012-09-11 DIAGNOSIS — F17200 Nicotine dependence, unspecified, uncomplicated: Secondary | ICD-10-CM | POA: Diagnosis not present

## 2012-09-11 DIAGNOSIS — M545 Low back pain: Secondary | ICD-10-CM | POA: Diagnosis not present

## 2012-09-15 DIAGNOSIS — R319 Hematuria, unspecified: Secondary | ICD-10-CM | POA: Diagnosis not present

## 2012-09-18 DIAGNOSIS — Z9889 Other specified postprocedural states: Secondary | ICD-10-CM | POA: Diagnosis not present

## 2012-09-18 DIAGNOSIS — IMO0002 Reserved for concepts with insufficient information to code with codable children: Secondary | ICD-10-CM | POA: Diagnosis not present

## 2012-09-27 DIAGNOSIS — IMO0002 Reserved for concepts with insufficient information to code with codable children: Secondary | ICD-10-CM | POA: Diagnosis not present

## 2012-10-10 DIAGNOSIS — Z79899 Other long term (current) drug therapy: Secondary | ICD-10-CM | POA: Diagnosis not present

## 2012-10-10 DIAGNOSIS — B957 Other staphylococcus as the cause of diseases classified elsewhere: Secondary | ICD-10-CM | POA: Diagnosis not present

## 2012-10-10 DIAGNOSIS — M545 Low back pain: Secondary | ICD-10-CM | POA: Diagnosis not present

## 2012-10-10 DIAGNOSIS — IMO0002 Reserved for concepts with insufficient information to code with codable children: Secondary | ICD-10-CM | POA: Diagnosis not present

## 2012-10-10 DIAGNOSIS — M5430 Sciatica, unspecified side: Secondary | ICD-10-CM | POA: Diagnosis not present

## 2012-10-11 DIAGNOSIS — R5381 Other malaise: Secondary | ICD-10-CM | POA: Diagnosis not present

## 2012-10-11 DIAGNOSIS — M545 Low back pain: Secondary | ICD-10-CM | POA: Diagnosis not present

## 2012-10-11 DIAGNOSIS — G4700 Insomnia, unspecified: Secondary | ICD-10-CM | POA: Diagnosis not present

## 2012-10-24 DIAGNOSIS — G4700 Insomnia, unspecified: Secondary | ICD-10-CM | POA: Diagnosis not present

## 2012-10-24 DIAGNOSIS — R5381 Other malaise: Secondary | ICD-10-CM | POA: Diagnosis not present

## 2012-10-24 DIAGNOSIS — M545 Low back pain: Secondary | ICD-10-CM | POA: Diagnosis not present

## 2012-10-24 DIAGNOSIS — J209 Acute bronchitis, unspecified: Secondary | ICD-10-CM | POA: Diagnosis not present

## 2012-11-05 DIAGNOSIS — IMO0002 Reserved for concepts with insufficient information to code with codable children: Secondary | ICD-10-CM | POA: Diagnosis not present

## 2012-11-05 DIAGNOSIS — X503XXA Overexertion from repetitive movements, initial encounter: Secondary | ICD-10-CM | POA: Diagnosis not present

## 2012-11-05 DIAGNOSIS — M25579 Pain in unspecified ankle and joints of unspecified foot: Secondary | ICD-10-CM | POA: Diagnosis not present

## 2012-11-05 DIAGNOSIS — S93409A Sprain of unspecified ligament of unspecified ankle, initial encounter: Secondary | ICD-10-CM | POA: Diagnosis not present

## 2012-11-05 DIAGNOSIS — L02619 Cutaneous abscess of unspecified foot: Secondary | ICD-10-CM | POA: Diagnosis not present

## 2012-11-05 DIAGNOSIS — S8990XA Unspecified injury of unspecified lower leg, initial encounter: Secondary | ICD-10-CM | POA: Diagnosis not present

## 2012-11-06 DIAGNOSIS — M48061 Spinal stenosis, lumbar region without neurogenic claudication: Secondary | ICD-10-CM | POA: Diagnosis not present

## 2012-11-06 DIAGNOSIS — IMO0002 Reserved for concepts with insufficient information to code with codable children: Secondary | ICD-10-CM | POA: Diagnosis not present

## 2012-12-10 DIAGNOSIS — I10 Essential (primary) hypertension: Secondary | ICD-10-CM | POA: Diagnosis not present

## 2012-12-10 DIAGNOSIS — F17200 Nicotine dependence, unspecified, uncomplicated: Secondary | ICD-10-CM | POA: Diagnosis not present

## 2012-12-10 DIAGNOSIS — M545 Low back pain: Secondary | ICD-10-CM | POA: Diagnosis not present

## 2012-12-10 DIAGNOSIS — R5381 Other malaise: Secondary | ICD-10-CM | POA: Diagnosis not present

## 2012-12-21 DIAGNOSIS — H6980 Other specified disorders of Eustachian tube, unspecified ear: Secondary | ICD-10-CM | POA: Diagnosis not present

## 2012-12-24 DIAGNOSIS — Z23 Encounter for immunization: Secondary | ICD-10-CM | POA: Diagnosis not present

## 2013-01-13 DIAGNOSIS — M545 Low back pain: Secondary | ICD-10-CM | POA: Diagnosis not present

## 2013-01-13 DIAGNOSIS — R5381 Other malaise: Secondary | ICD-10-CM | POA: Diagnosis not present

## 2013-01-13 DIAGNOSIS — G4700 Insomnia, unspecified: Secondary | ICD-10-CM | POA: Diagnosis not present

## 2013-01-13 DIAGNOSIS — I10 Essential (primary) hypertension: Secondary | ICD-10-CM | POA: Diagnosis not present

## 2013-01-15 DIAGNOSIS — M48061 Spinal stenosis, lumbar region without neurogenic claudication: Secondary | ICD-10-CM | POA: Diagnosis not present

## 2013-01-15 DIAGNOSIS — M545 Low back pain, unspecified: Secondary | ICD-10-CM | POA: Diagnosis not present

## 2013-01-15 DIAGNOSIS — IMO0002 Reserved for concepts with insufficient information to code with codable children: Secondary | ICD-10-CM | POA: Diagnosis not present

## 2013-01-27 DIAGNOSIS — F339 Major depressive disorder, recurrent, unspecified: Secondary | ICD-10-CM | POA: Diagnosis not present

## 2013-02-18 DIAGNOSIS — G4700 Insomnia, unspecified: Secondary | ICD-10-CM | POA: Diagnosis not present

## 2013-02-18 DIAGNOSIS — M545 Low back pain, unspecified: Secondary | ICD-10-CM | POA: Diagnosis not present

## 2013-02-18 DIAGNOSIS — J449 Chronic obstructive pulmonary disease, unspecified: Secondary | ICD-10-CM | POA: Diagnosis not present

## 2013-02-18 DIAGNOSIS — I10 Essential (primary) hypertension: Secondary | ICD-10-CM | POA: Diagnosis not present

## 2013-03-21 DIAGNOSIS — R5383 Other fatigue: Secondary | ICD-10-CM | POA: Diagnosis not present

## 2013-03-21 DIAGNOSIS — J449 Chronic obstructive pulmonary disease, unspecified: Secondary | ICD-10-CM | POA: Diagnosis not present

## 2013-03-21 DIAGNOSIS — G4700 Insomnia, unspecified: Secondary | ICD-10-CM | POA: Diagnosis not present

## 2013-03-21 DIAGNOSIS — I10 Essential (primary) hypertension: Secondary | ICD-10-CM | POA: Diagnosis not present

## 2013-03-21 DIAGNOSIS — R5381 Other malaise: Secondary | ICD-10-CM | POA: Diagnosis not present

## 2013-04-18 DIAGNOSIS — R5383 Other fatigue: Secondary | ICD-10-CM | POA: Diagnosis not present

## 2013-04-18 DIAGNOSIS — I10 Essential (primary) hypertension: Secondary | ICD-10-CM | POA: Diagnosis not present

## 2013-04-18 DIAGNOSIS — R5381 Other malaise: Secondary | ICD-10-CM | POA: Diagnosis not present

## 2013-04-18 DIAGNOSIS — IMO0001 Reserved for inherently not codable concepts without codable children: Secondary | ICD-10-CM | POA: Diagnosis not present

## 2013-04-18 DIAGNOSIS — J449 Chronic obstructive pulmonary disease, unspecified: Secondary | ICD-10-CM | POA: Diagnosis not present

## 2013-04-23 DIAGNOSIS — IMO0002 Reserved for concepts with insufficient information to code with codable children: Secondary | ICD-10-CM | POA: Diagnosis not present

## 2013-04-23 DIAGNOSIS — M5126 Other intervertebral disc displacement, lumbar region: Secondary | ICD-10-CM | POA: Diagnosis not present

## 2013-04-23 DIAGNOSIS — F17200 Nicotine dependence, unspecified, uncomplicated: Secondary | ICD-10-CM | POA: Diagnosis not present

## 2013-04-23 DIAGNOSIS — Z9889 Other specified postprocedural states: Secondary | ICD-10-CM | POA: Diagnosis not present

## 2013-05-16 DIAGNOSIS — M545 Low back pain, unspecified: Secondary | ICD-10-CM | POA: Diagnosis not present

## 2013-05-16 DIAGNOSIS — G4700 Insomnia, unspecified: Secondary | ICD-10-CM | POA: Diagnosis not present

## 2013-05-16 DIAGNOSIS — E119 Type 2 diabetes mellitus without complications: Secondary | ICD-10-CM | POA: Diagnosis not present

## 2013-05-16 DIAGNOSIS — I10 Essential (primary) hypertension: Secondary | ICD-10-CM | POA: Diagnosis not present

## 2013-06-02 DIAGNOSIS — F339 Major depressive disorder, recurrent, unspecified: Secondary | ICD-10-CM | POA: Diagnosis not present

## 2013-06-13 DIAGNOSIS — M545 Low back pain, unspecified: Secondary | ICD-10-CM | POA: Diagnosis not present

## 2013-06-13 DIAGNOSIS — IMO0001 Reserved for inherently not codable concepts without codable children: Secondary | ICD-10-CM | POA: Diagnosis not present

## 2013-06-13 DIAGNOSIS — G4700 Insomnia, unspecified: Secondary | ICD-10-CM | POA: Diagnosis not present

## 2013-06-13 DIAGNOSIS — I10 Essential (primary) hypertension: Secondary | ICD-10-CM | POA: Diagnosis not present

## 2013-07-11 DIAGNOSIS — IMO0001 Reserved for inherently not codable concepts without codable children: Secondary | ICD-10-CM | POA: Diagnosis not present

## 2013-07-11 DIAGNOSIS — G4700 Insomnia, unspecified: Secondary | ICD-10-CM | POA: Diagnosis not present

## 2013-07-11 DIAGNOSIS — I10 Essential (primary) hypertension: Secondary | ICD-10-CM | POA: Diagnosis not present

## 2013-07-11 DIAGNOSIS — R5381 Other malaise: Secondary | ICD-10-CM | POA: Diagnosis not present

## 2013-07-11 DIAGNOSIS — R5383 Other fatigue: Secondary | ICD-10-CM | POA: Diagnosis not present

## 2013-08-20 DIAGNOSIS — IMO0002 Reserved for concepts with insufficient information to code with codable children: Secondary | ICD-10-CM | POA: Diagnosis not present

## 2013-08-22 DIAGNOSIS — F329 Major depressive disorder, single episode, unspecified: Secondary | ICD-10-CM | POA: Diagnosis not present

## 2013-08-22 DIAGNOSIS — F3289 Other specified depressive episodes: Secondary | ICD-10-CM | POA: Diagnosis not present

## 2013-08-22 DIAGNOSIS — Z8614 Personal history of Methicillin resistant Staphylococcus aureus infection: Secondary | ICD-10-CM | POA: Diagnosis not present

## 2013-08-22 DIAGNOSIS — Z9089 Acquired absence of other organs: Secondary | ICD-10-CM | POA: Diagnosis not present

## 2013-08-22 DIAGNOSIS — M1990 Unspecified osteoarthritis, unspecified site: Secondary | ICD-10-CM | POA: Diagnosis not present

## 2013-08-22 DIAGNOSIS — F411 Generalized anxiety disorder: Secondary | ICD-10-CM | POA: Diagnosis not present

## 2013-08-22 DIAGNOSIS — L0390 Cellulitis, unspecified: Secondary | ICD-10-CM | POA: Diagnosis not present

## 2013-08-22 DIAGNOSIS — I10 Essential (primary) hypertension: Secondary | ICD-10-CM | POA: Diagnosis not present

## 2013-08-22 DIAGNOSIS — E119 Type 2 diabetes mellitus without complications: Secondary | ICD-10-CM | POA: Diagnosis not present

## 2013-08-22 DIAGNOSIS — L0291 Cutaneous abscess, unspecified: Secondary | ICD-10-CM | POA: Diagnosis not present

## 2013-08-22 DIAGNOSIS — F17200 Nicotine dependence, unspecified, uncomplicated: Secondary | ICD-10-CM | POA: Diagnosis not present

## 2013-08-22 DIAGNOSIS — IMO0002 Reserved for concepts with insufficient information to code with codable children: Secondary | ICD-10-CM | POA: Diagnosis not present

## 2013-09-09 DIAGNOSIS — F339 Major depressive disorder, recurrent, unspecified: Secondary | ICD-10-CM | POA: Diagnosis not present

## 2013-09-12 DIAGNOSIS — G4700 Insomnia, unspecified: Secondary | ICD-10-CM | POA: Diagnosis not present

## 2013-09-12 DIAGNOSIS — G8929 Other chronic pain: Secondary | ICD-10-CM | POA: Diagnosis not present

## 2013-09-12 DIAGNOSIS — E119 Type 2 diabetes mellitus without complications: Secondary | ICD-10-CM | POA: Diagnosis not present

## 2013-09-12 DIAGNOSIS — J449 Chronic obstructive pulmonary disease, unspecified: Secondary | ICD-10-CM | POA: Diagnosis not present

## 2013-09-21 ENCOUNTER — Emergency Department (HOSPITAL_COMMUNITY): Payer: Medicare Other

## 2013-09-21 ENCOUNTER — Emergency Department (HOSPITAL_COMMUNITY)
Admission: EM | Admit: 2013-09-21 | Discharge: 2013-09-21 | Disposition: A | Discharge status: Home / Self Care | Payer: Medicare Other | Attending: Emergency Medicine | Admitting: Emergency Medicine

## 2013-09-21 ENCOUNTER — Encounter (HOSPITAL_COMMUNITY): Payer: Self-pay | Admitting: Emergency Medicine

## 2013-09-21 ENCOUNTER — Other Ambulatory Visit (HOSPITAL_COMMUNITY): Payer: Medicare Other

## 2013-09-21 DIAGNOSIS — F17200 Nicotine dependence, unspecified, uncomplicated: Secondary | ICD-10-CM | POA: Diagnosis not present

## 2013-09-21 DIAGNOSIS — R109 Unspecified abdominal pain: Secondary | ICD-10-CM | POA: Insufficient documentation

## 2013-09-21 DIAGNOSIS — R059 Cough, unspecified: Secondary | ICD-10-CM | POA: Diagnosis not present

## 2013-09-21 DIAGNOSIS — I10 Essential (primary) hypertension: Secondary | ICD-10-CM | POA: Insufficient documentation

## 2013-09-21 DIAGNOSIS — R079 Chest pain, unspecified: Secondary | ICD-10-CM | POA: Diagnosis not present

## 2013-09-21 DIAGNOSIS — Z79899 Other long term (current) drug therapy: Secondary | ICD-10-CM | POA: Diagnosis not present

## 2013-09-21 DIAGNOSIS — R05 Cough: Secondary | ICD-10-CM | POA: Diagnosis not present

## 2013-09-21 DIAGNOSIS — M546 Pain in thoracic spine: Secondary | ICD-10-CM | POA: Insufficient documentation

## 2013-09-21 DIAGNOSIS — E119 Type 2 diabetes mellitus without complications: Secondary | ICD-10-CM | POA: Diagnosis not present

## 2013-09-21 DIAGNOSIS — Z87442 Personal history of urinary calculi: Secondary | ICD-10-CM | POA: Insufficient documentation

## 2013-09-21 HISTORY — DX: Essential (primary) hypertension: I10

## 2013-09-21 HISTORY — DX: Calculus of kidney: N20.0

## 2013-09-21 HISTORY — DX: Type 2 diabetes mellitus without complications: E11.9

## 2013-09-21 LAB — CBC WITH DIFFERENTIAL/PLATELET
Basophils Absolute: 0.1 10*3/uL (ref 0.0–0.1)
Basophils Relative: 1 % (ref 0–1)
EOS ABS: 0.2 10*3/uL (ref 0.0–0.7)
Eosinophils Relative: 2 % (ref 0–5)
HCT: 39.6 % (ref 39.0–52.0)
Hemoglobin: 13.1 g/dL (ref 13.0–17.0)
LYMPHS ABS: 3.4 10*3/uL (ref 0.7–4.0)
LYMPHS PCT: 31 % (ref 12–46)
MCH: 29.2 pg (ref 26.0–34.0)
MCHC: 33.1 g/dL (ref 30.0–36.0)
MCV: 88.2 fL (ref 78.0–100.0)
Monocytes Absolute: 0.5 10*3/uL (ref 0.1–1.0)
Monocytes Relative: 5 % (ref 3–12)
NEUTROS ABS: 6.9 10*3/uL (ref 1.7–7.7)
NEUTROS PCT: 61 % (ref 43–77)
PLATELETS: 255 10*3/uL (ref 150–400)
RBC: 4.49 MIL/uL (ref 4.22–5.81)
RDW: 15.9 % — ABNORMAL HIGH (ref 11.5–15.5)
WBC: 11.1 10*3/uL — AB (ref 4.0–10.5)

## 2013-09-21 LAB — URINALYSIS, ROUTINE W REFLEX MICROSCOPIC
BILIRUBIN URINE: NEGATIVE
Glucose, UA: NEGATIVE mg/dL
HGB URINE DIPSTICK: NEGATIVE
Ketones, ur: NEGATIVE mg/dL
Leukocytes, UA: NEGATIVE
Nitrite: NEGATIVE
Protein, ur: NEGATIVE mg/dL
SPECIFIC GRAVITY, URINE: 1.021 (ref 1.005–1.030)
UROBILINOGEN UA: 0.2 mg/dL (ref 0.0–1.0)
pH: 6 (ref 5.0–8.0)

## 2013-09-21 LAB — COMPREHENSIVE METABOLIC PANEL
ALK PHOS: 82 U/L (ref 39–117)
ALT: 22 U/L (ref 0–53)
AST: 16 U/L (ref 0–37)
Albumin: 3.9 g/dL (ref 3.5–5.2)
Anion gap: 12 (ref 5–15)
BUN: 16 mg/dL (ref 6–23)
CO2: 26 meq/L (ref 19–32)
Calcium: 9 mg/dL (ref 8.4–10.5)
Chloride: 105 mEq/L (ref 96–112)
Creatinine, Ser: 0.63 mg/dL (ref 0.50–1.35)
GFR calc non Af Amer: >90 mL/min (ref >90)
GLUCOSE: 95 mg/dL (ref 70–99)
POTASSIUM: 4.4 meq/L (ref 3.7–5.3)
SODIUM: 143 meq/L (ref 137–147)
TOTAL PROTEIN: 7.2 g/dL (ref 6.0–8.3)
Total Bilirubin: <0.2 mg/dL — ABNORMAL LOW (ref 0.3–1.2)

## 2013-09-21 MED ORDER — METHOCARBAMOL 500 MG PO TABS: 500.0000 mg | ORAL_TABLET | Freq: Two times a day (BID) | ORAL | Status: DC

## 2013-09-21 MED ORDER — KETOROLAC TROMETHAMINE 30 MG/ML IJ SOLN
30.0000 mg | Freq: Once | INTRAMUSCULAR | Status: AC
  Administered 2013-09-21: 30 mg via INTRAMUSCULAR
  Filled 2013-09-21: qty 1

## 2013-09-21 MED ORDER — MORPHINE SULFATE 4 MG/ML IJ SOLN
6.0000 mg | Freq: Once | INTRAMUSCULAR | Status: AC
  Administered 2013-09-21: 6 mg via INTRAVENOUS
  Filled 2013-09-21: qty 2

## 2013-09-21 NOTE — ED Notes (Signed)
He states hes had R flank pain x 3 days. hes had kidney stones in the past. He denies any n/v/bowel/bladder changes.

## 2013-09-21 NOTE — ED Provider Notes (Signed)
CSN: [id number]     Arrival date & time 09/21/13  1049 History   First MD Initiated Contact with Patient 09/21/13 1114     Chief Complaint  Patient presents with  . Flank Pain     (Consider location/radiation/quality/duration/timing/severity/associated sxs/prior Treatment) HPI Comments: Patient is a 38 yo M PMHx significant for HTN, DM, tobacco abuse presenting to the ED for right upper back/flank pain for three days. He states he has had pressure to the area "for awhile" but the pain began three days ago. States it is constant. Alleviating factors: none. Aggravating factors: none. Medications tried prior to arrival: Oxycodone. Endorses associated non-productive cough. Denies any fevers, chills, nausea, vomiting, diarrhea, abdominal pain.      Past Medical History  Diagnosis Date  . Kidney stone   . Hypertension   . Diabetes mellitus without complication    Past Surgical History  Procedure Laterality Date  . Cholecystectomy    . Back surgery     History reviewed. No pertinent family history. History  Substance Use Topics  . Smoking status: Current Every Day Smoker    Types: Cigarettes  . Smokeless tobacco: Not on file  . Alcohol Use: No    Review of Systems  Constitutional: Negative for fever and chills.  Respiratory: Positive for cough. Negative for shortness of breath.   Cardiovascular: Negative for chest pain and leg swelling.  Gastrointestinal: Negative for nausea, vomiting, abdominal pain, diarrhea and constipation.  Genitourinary: Positive for flank pain.  Musculoskeletal: Positive for back pain.  All other systems reviewed and are negative.     Allergies  Review of patient's allergies indicates no known allergies.  Home Medications   Prior to Admission medications   Medication Sig Start Date End Date Taking? Authorizing Provider  atenolol (TENORMIN) 25 MG tablet Take 25 mg by mouth daily.  09/01/13  Yes Historical Provider, MD  gabapentin (NEURONTIN) 600  MG tablet Take 600 mg by mouth 2 (two) times daily.  09/09/13  Yes Historical Provider, MD  metFORMIN (GLUCOPHAGE) 500 MG tablet Take 500 mg by mouth 2 (two) times daily with a meal.   Yes Historical Provider, MD  Oxycodone HCl 10 MG TABS Take 10 mg by mouth 3 (three) times daily as needed (for pain).  09/12/13  Yes Historical Provider, MD  traZODone (DESYREL) 150 MG tablet Take 150 mg by mouth at bedtime.  08/21/13  Yes Historical Provider, MD   BP 174/105  Pulse 63  Temp(Src) 97.6 F (36.4 C) (Oral)  Resp 18  Ht  (1.753 m)  Wt 260 lb (117.935 kg)  BMI 38.38 kg/m2  SpO2 97% Physical Exam  Nursing note and vitals reviewed. Constitutional: He is oriented to person, place, and time. He appears well-developed and well-nourished. No distress.  HENT:  Head: Normocephalic and atraumatic.  Right Ear: External ear normal.  Left Ear: External ear normal.  Nose: Nose normal.  Mouth/Throat: Oropharynx is clear and moist. No oropharyngeal exudate.  Eyes: Conjunctivae are normal.  Neck: Normal range of motion. Neck supple.  Cardiovascular: Normal rate, regular rhythm and normal heart sounds.   Pulmonary/Chest: Effort normal and breath sounds normal. No respiratory distress. He exhibits no tenderness.  Coughing on examination  Abdominal: Soft. There is no tenderness. There is no rigidity, no rebound, no guarding and no CVA tenderness.  Musculoskeletal: Normal range of motion.       Back:  Neurological: He is alert and oriented to person, place, and time.  Skin: Skin is warm  and dry. He is not diaphoretic.  Psychiatric: He has a normal mood and affect.    ED Course  Procedures (including critical care time) Medications  ketorolac (TORADOL) 30 MG/ML injection 30 mg (30 mg Intramuscular Given 09/21/13 1210)    Labs Review Labs Reviewed  CBC WITH DIFFERENTIAL - Abnormal; Notable for the following:    WBC 11.1 (*)    RDW 15.9 (*)    All other components within normal limits    COMPREHENSIVE METABOLIC PANEL - Abnormal; Notable for the following:    Total Bilirubin <0.2 (*)    All other components within normal limits  URINALYSIS, ROUTINE W REFLEX MICROSCOPIC    Imaging Review Dg Chest 2 View  09/21/2013   CLINICAL DATA:  38 year old male with right side chest back pain and nonproductive cough. Nausea. Initial encounter.  EXAM: CHEST  2 VIEW  COMPARISON:  CT Abdomen and Pelvis 03/14/2012 and earlier.  FINDINGS: Stable and normal lung volumes. Stable cardiac size at the upper limits of normal. Other mediastinal contours are within normal limits. Visualized tracheal air column is within normal limits. The lungs are clear. No pneumothorax or effusion. No acute osseous abnormality identified.  IMPRESSION: No acute cardiopulmonary abnormality.   Electronically Signed   By: Augusto Gamble M.D.   On: 09/21/2013 13:44     EKG Interpretation None      MDM   Final diagnoses:  Right-sided thoracic back pain    Filed Vitals:   09/21/13 1149  BP: 174/105  Pulse: 63  Temp:   Resp: 18   Afebrile, NAD, non-toxic appearing, AAOx4. Abdomen soft, nontender, nondistended. No CVA tenderness. Lungs clear to auscultation. Nonproductive cough appreciated on examination. I have reviewed nursing notes, vital signs, and all appropriate lab and imaging results for this patient. No evidence of renal or ureteral stone on CT scan. No evidence of pulmonary infection. Will treat musculoskeletal symptoms. Return precautions discussed. Patient is agreeable to plan. Patient is stable at time of discharge. Patient d/w with Dr. Jodi Mourning, agrees with plan.          Jeannetta Ellis, PA-C 09/21/13 [phone]

## 2013-09-21 NOTE — Discharge Instructions (Signed)
Please follow up with your primary care physician in 1-2 days. If you do not have one please call the [facility] number listed above. Please take pain medication and/or muscle relaxants as prescribed and as needed for pain. Please do not drive on narcotic pain medication or on muscle relaxants. Please read all discharge instructions and return precautions.  ° °Back Pain, Adult °Low back pain is very common. About 1 in 5 people have back pain. The cause of low back pain is rarely dangerous. The pain often gets better over time. About half of people with a sudden onset of back pain feel better in just 2 weeks. About 8 in 10 people feel better by 6 weeks.  °CAUSES °Some common causes of back pain include: °· Strain of the muscles or ligaments supporting the spine. °· Wear and tear (degeneration) of the spinal discs. °· Arthritis. °· Direct injury to the back. °DIAGNOSIS °Most of the time, the direct cause of low back pain is not known. However, back pain can be treated effectively even when the exact cause of the pain is unknown. Answering your caregiver's questions about your overall health and symptoms is one of the most accurate ways to make sure the cause of your pain is not dangerous. If your caregiver needs more information, he or she may order lab work or imaging tests (X-rays or MRIs). However, even if imaging tests show changes in your back, this usually does not require surgery. °HOME CARE INSTRUCTIONS °For many people, back pain returns. Since low back pain is rarely dangerous, it is often a condition that people can learn to manage on their own.  °· Remain active. It is stressful on the back to sit or stand in one place. Do not sit, drive, or stand in one place for more than 30 minutes at a time. Take short walks on level surfaces as soon as pain allows. Try to increase the length of time you walk each day. °· Do not stay in bed. Resting more than 1 or 2 days can delay your  recovery. °· Do not avoid exercise or work. Your body is made to move. It is not dangerous to be active, even though your back may hurt. Your back will likely heal faster if you return to being active before your pain is gone. °· Pay attention to your body when you  bend and lift. Many people have less discomfort when lifting if they bend their knees, keep the load close to their bodies, and avoid twisting. Often, the most comfortable positions are those that put less stress on your recovering back. °· Find a comfortable position to sleep. Use a firm mattress and lie on your side with your knees slightly bent. If you lie on your back, put a pillow under your knees. °· Only take over-the-counter or prescription medicines as directed by your caregiver. Over-the-counter medicines to reduce pain and inflammation are often the most helpful. Your caregiver may prescribe muscle relaxant drugs. These medicines help dull your pain so you can more quickly return to your normal activities and healthy exercise. °· Put ice on the injured area. °¨ Put ice in a plastic bag. °¨ Place a towel between your skin and the bag. °¨ Leave the ice on for 15-20 minutes, 03-04 times a day for the first 2 to 3 days. After that, ice and heat may be alternated to reduce pain and spasms. °· Ask your caregiver about trying back exercises and gentle massage. This may be of some benefit. °· Avoid feeling anxious or stressed. Stress increases muscle tension and   can worsen back pain. It is important to recognize when you are anxious or stressed and learn ways to manage it. Exercise is a great option. °SEEK MEDICAL CARE IF: °· You have pain that is not relieved with rest or medicine. °· You have pain that does not improve in 1 week. °· You have new symptoms. °· You are generally not feeling well. °SEEK IMMEDIATE MEDICAL CARE IF:  °· You have pain that radiates from your back into your legs. °· You develop new bowel or bladder control problems. °· You  have unusual weakness or numbness in your arms or legs. °· You develop nausea or vomiting. °· You develop abdominal pain. °· You feel faint. °Document Released: 01/09/2005 Document Revised: 07/11/2011 Document Reviewed: 05/13/2013 °ExitCare® Patient Information ©2015 [employer]. This information is not intended to replace advice given to you by your health care provider. Make sure you discuss any questions you have with your health care provider. ° °

## 2013-09-21 NOTE — ED Notes (Signed)
Patient returned from X-ray 

## 2013-09-21 NOTE — ED Notes (Signed)
Patient transported to X-ray 

## 2013-09-21 NOTE — ED Notes (Signed)
Patient returned from CT

## 2013-09-21 NOTE — ED Provider Notes (Signed)
Medical screening examination/treatment/procedure(s) were conducted as a shared visit with non-physician practitioner(s) or resident and myself. I personally evaluated the patient during the encounter and agree with the findings.  I have personally reviewed any xrays and/ or EKG's with the provider and I agree with interpretation.  Pt with intermittent, gradually worsening right flank pain. Different than previous kidney stone hx.  Pressure with worsening with palpation. GB removed in the past. No improvement on recheck, CT stone and morphine. Exam moderate right flank pain, abd soft/ non tender, non toxic appearing.  Dg Chest 2 View  09/21/2013   CLINICAL DATA:  38 year old male with right side chest back pain and nonproductive cough. Nausea. Initial encounter.  EXAM: CHEST  2 VIEW  COMPARISON:  CT Abdomen and Pelvis 03/14/2012 and earlier.  FINDINGS: Stable and normal lung volumes. Stable cardiac size at the upper limits of normal. Other mediastinal contours are within normal limits. Visualized tracheal air column is within normal limits. The lungs are clear. No pneumothorax or effusion. No acute osseous abnormality identified.  IMPRESSION: No acute cardiopulmonary abnormality.   Electronically Signed   By: Augusto Gamble M.D.   On: 09/21/2013 13:44   Ct Renal Stone Study  09/21/2013   CLINICAL DATA:  Right flank pain.  EXAM: CT RENAL STONE PROTOCOL  TECHNIQUE: Multidetector CT imaging of the abdomen and pelvis was performed following the standard protocol without intravenous contrast  COMPARISON:  CT 03/14/2012.  FINDINGS: Liver normal. Spleen normal. Splenosis. Pancreas normal. No biliary distention. Cholecystectomy. No focal renal abnormality identified. The kidneys appear stable. No hydronephrosis or obstructing ureteral stone. Bladder is nondistended. Prostate is not enlarged.  Shotty inguinal lymph nodes. Shotty retroperitoneal lymph nodes. Mild aortoiliac atherosclerotic vascular disease.  Appendix  normal. No evidence of bowel obstruction. No free air. Stomach is nondistended. No mesenteric mass. No evidence of bowel herniation. Small inguinal hernias with herniation of fat only noted.  Lung bases are clear. Heart size normal. Degenerative changes lumbar spine.  IMPRESSION: No acute abnormality. No focal abnormality noted to explain the patient's right flank pain.   Electronically Signed   By: Maisie Fus  Register   On: 09/21/2013 15:11    Right flank pain   Enid Skeens, MD 09/21/13 1740

## 2013-10-03 ENCOUNTER — Encounter (HOSPITAL_COMMUNITY): Payer: Self-pay | Admitting: Emergency Medicine

## 2013-10-03 ENCOUNTER — Emergency Department (HOSPITAL_COMMUNITY)
Admission: EM | Admit: 2013-10-03 | Discharge: 2013-10-03 | Disposition: A | Discharge status: Home / Self Care | Payer: Medicare Other | Attending: Emergency Medicine | Admitting: Emergency Medicine

## 2013-10-03 DIAGNOSIS — M79609 Pain in unspecified limb: Secondary | ICD-10-CM | POA: Diagnosis not present

## 2013-10-03 DIAGNOSIS — F17200 Nicotine dependence, unspecified, uncomplicated: Secondary | ICD-10-CM | POA: Diagnosis not present

## 2013-10-03 DIAGNOSIS — G8929 Other chronic pain: Secondary | ICD-10-CM | POA: Diagnosis not present

## 2013-10-03 DIAGNOSIS — I10 Essential (primary) hypertension: Secondary | ICD-10-CM | POA: Insufficient documentation

## 2013-10-03 DIAGNOSIS — Z87442 Personal history of urinary calculi: Secondary | ICD-10-CM | POA: Insufficient documentation

## 2013-10-03 DIAGNOSIS — E119 Type 2 diabetes mellitus without complications: Secondary | ICD-10-CM | POA: Diagnosis not present

## 2013-10-03 DIAGNOSIS — Z79899 Other long term (current) drug therapy: Secondary | ICD-10-CM | POA: Diagnosis not present

## 2013-10-03 DIAGNOSIS — M5430 Sciatica, unspecified side: Secondary | ICD-10-CM | POA: Diagnosis present

## 2013-10-03 MED ORDER — OXYCODONE-ACETAMINOPHEN 5-325 MG PO TABS
2.0000 | ORAL_TABLET | Freq: Once | ORAL | Status: AC
  Administered 2013-10-03: 2 via ORAL
  Filled 2013-10-03: qty 2

## 2013-10-03 MED ORDER — OXYCODONE HCL 5 MG PO TABS: 5.0000 mg | ORAL_TABLET | ORAL | Status: DC | PRN

## 2013-10-03 MED ORDER — HYDROMORPHONE HCL PF 1 MG/ML IJ SOLN
1.0000 mg | Freq: Once | INTRAMUSCULAR | Status: AC
  Administered 2013-10-03: 1 mg via INTRAMUSCULAR
  Filled 2013-10-03: qty 1

## 2013-10-03 NOTE — ED Notes (Signed)
Pt complains of bilateral leg and hip pain severely the last few days, no new injury

## 2013-10-03 NOTE — Discharge Instructions (Signed)
Back Pain, Adult °Back pain is very common. The pain often gets better over time. The cause of back pain is usually not dangerous. Most people can learn to manage their back pain on their own.  °HOME CARE  °· Stay active. Start with short walks on flat ground if you can. Try to walk farther each day. °· Do not sit, drive, or stand in one place for more than 30 minutes. Do not stay in bed. °· Do not avoid exercise or work. Activity can help your back heal faster. °· Be careful when you bend or lift an object. Bend at your knees, keep the object close to you, and do not twist. °· Sleep on a firm mattress. Lie on your side, and bend your knees. If you lie on your back, put a pillow under your knees. °· Only take medicines as told by your doctor. °· Put ice on the injured area. °¨ Put ice in a plastic bag. °¨ Place a towel between your skin and the bag. °¨ Leave the ice on for 15-20 minutes, 03-04 times a day for the first 2 to 3 days. After that, you can switch between ice and heat packs. °· Ask your doctor about back exercises or massage. °· Avoid feeling anxious or stressed. Find good ways to deal with stress, such as exercise. °GET HELP RIGHT AWAY IF:  °· Your pain does not go away with rest or medicine. °· Your pain does not go away in 1 week. °· You have new problems. °· You do not feel well. °· The pain spreads into your legs. °· You cannot control when you poop (bowel movement) or pee (urinate). °· Your arms or legs feel weak or lose feeling (numbness). °· You feel sick to your stomach (nauseous) or throw up (vomit). °· You have belly (abdominal) pain. °· You feel like you may pass out (faint). °MAKE SURE YOU:  °· Understand these instructions. °· Will watch your condition. °· Will get help right away if you are not doing well or get worse. °Document Released: 06/28/2007 Document Revised: 04/03/2011 Document Reviewed: 05/13/2013 °ExitCare® Patient Information ©2015 [employer]. This information is not intended  to replace advice given to you by your health care provider. Make sure you discuss any questions you have with your health care provider. ° °

## 2013-10-03 NOTE — ED Provider Notes (Signed)
CSN: [id number]     Arrival date & time 10/03/13  6295 History   First MD Initiated Contact with Patient 10/03/13 0732     Chief Complaint  Patient presents with  . Leg Pain     (Consider location/radiation/quality/duration/timing/severity/associated sxs/prior Treatment) Patient is a 38 y.o. male presenting with back pain. The history is provided by the patient.  Back Pain Location:  Lumbar spine Quality:  Aching Radiates to:  L foot and R foot Pain severity:  Moderate Pain is:  Same all the time Onset quality:  Gradual Duration:  2 days Timing:  Constant Progression:  Unchanged Chronicity: chronic, w/ acute worsening. Context comment:  Let his friend pop his back Relieved by:  Nothing Worsened by:  Ambulation (supine position) Ineffective treatments: percocet, nsaids. Associated symptoms: no abdominal pain, no bladder incontinence, no bowel incontinence, no chest pain, no dysuria, no fever, no headaches, no numbness, no paresthesias, no tingling and no weakness   Associated symptoms comment:  Right leg gives out occasionally   Past Medical History  Diagnosis Date  . Kidney stone   . Hypertension   . Diabetes mellitus without complication    Past Surgical History  Procedure Laterality Date  . Cholecystectomy    . Back surgery     History reviewed. No pertinent family history. History  Substance Use Topics  . Smoking status: Current Every Day Smoker    Types: Cigarettes  . Smokeless tobacco: Not on file  . Alcohol Use: No    Review of Systems  Constitutional: Negative for fever.  HENT: Negative for drooling and rhinorrhea.   Eyes: Negative for pain.  Respiratory: Negative for cough and shortness of breath.   Cardiovascular: Negative for chest pain and leg swelling.  Gastrointestinal: Negative for nausea, vomiting, abdominal pain, diarrhea and bowel incontinence.  Genitourinary: Negative for bladder incontinence, dysuria and hematuria.  Musculoskeletal:  Positive for back pain. Negative for gait problem and neck pain.  Skin: Negative for color change.  Neurological: Negative for tingling, weakness, numbness, headaches and paresthesias.  Hematological: Negative for adenopathy.  Psychiatric/Behavioral: Negative for behavioral problems.  All other systems reviewed and are negative.     Allergies  Review of patient's allergies indicates no known allergies.  Home Medications   Prior to Admission medications   Medication Sig Start Date End Date Taking? Authorizing Provider  acetaminophen (TYLENOL) 500 MG tablet Take 1,000 mg by mouth every 6 (six) hours as needed for mild pain.   Yes Historical Provider, MD  atenolol (TENORMIN) 25 MG tablet Take 25 mg by mouth daily.  09/01/13  Yes Historical Provider, MD  gabapentin (NEURONTIN) 600 MG tablet Take 600 mg by mouth 2 (two) times daily.  09/09/13  Yes Historical Provider, MD  ibuprofen (ADVIL,MOTRIN) 200 MG tablet Take 800 mg by mouth every 6 (six) hours as needed for moderate pain.   Yes Historical Provider, MD  metFORMIN (GLUCOPHAGE) 500 MG tablet Take 500 mg by mouth 2 (two) times daily with a meal.   Yes Historical Provider, MD  methocarbamol (ROBAXIN) 500 MG tablet Take 1 tablet (500 mg total) by mouth 2 (two) times daily. 09/21/13  Yes Jennifer L Piepenbrink, PA-C  Oxycodone HCl 10 MG TABS Take 10 mg by mouth 3 (three) times daily as needed (for pain).  09/12/13  Yes Historical Provider, MD  traZODone (DESYREL) 150 MG tablet Take 150 mg by mouth at bedtime.  08/21/13  Yes Historical Provider, MD   BP 174/93  Pulse 92  Temp(Src)  97.7 F (36.5 C) (Oral)  Resp 20  SpO2 100% Physical Exam  Nursing note and vitals reviewed. Constitutional: He is oriented to person, place, and time. He appears well-developed and well-nourished.  HENT:  Head: Normocephalic and atraumatic.  Right Ear: External ear normal.  Left Ear: External ear normal.  Nose: Nose normal.  Mouth/Throat: Oropharynx is clear  and moist. No oropharyngeal exudate.  Eyes: Conjunctivae and EOM are normal. Pupils are equal, round, and reactive to light.  Neck: Normal range of motion. Neck supple.  Cardiovascular: Normal rate, regular rhythm, normal heart sounds and intact distal pulses.  Exam reveals no gallop and no friction rub.   No murmur heard. Pulmonary/Chest: Effort normal and breath sounds normal. No respiratory distress. He has no wheezes.  Abdominal: Soft. Bowel sounds are normal. He exhibits no distension. There is no tenderness. There is no rebound and no guarding.  Musculoskeletal: Normal range of motion. He exhibits tenderness (mild ttp of mid lower lumbar and lower lumbar paraspinal area bilaterally). He exhibits no edema.  Neurological: He is alert and oriented to person, place, and time.  Normal strength and sensation in bilateral LE's. Pt is ambulatory w/out difficulty.   Skin: Skin is warm and dry.  Psychiatric: He has a normal mood and affect. His behavior is normal.    ED Course  Procedures (including critical care time) Labs Review Labs Reviewed - No data to display  Imaging Review No results found.   EKG Interpretation None      MDM   Final diagnoses:  Sciatica, unspecified laterality    7:57 AM 38 y.o. male w hx of chronic back pain who pw worsening bilateral LE shooting pains since he let his friend pop his low back 2 days ago. He has had similar pains in the past but they have been acutely worse since he let his friend pop his back. He denies any back pain red flags. He is afebrile and vital signs are unremarkable here. He is ambulatory without difficulty. Will get pain control. Do not think imaging needed. He takes oxycodone 10 mg tablets. Will provide a small prescription for 5 mg tablets for breakthrough pain.  8:10 AM:  I have discussed the diagnosis/risks/treatment options with the patient and believe the pt to be eligible for discharge home to follow-up with his back surgeon,  Dr. Wyline Mood. We also discussed returning to the ED immediately if new or worsening sx occur. We discussed the sx which are most concerning (e.g., weakness, numbness, bowel/bladder incont, fever) that necessitate immediate return. Medications administered to the patient during their visit and any new prescriptions provided to the patient are listed below.  Medications given during this visit Medications  HYDROmorphone (DILAUDID) injection 1 mg (not administered)  oxyCODONE-acetaminophen (PERCOCET/ROXICET) 5-325 MG per tablet 2 tablet (not administered)    New Prescriptions   OXYCODONE (ROXICODONE) 5 MG IMMEDIATE RELEASE TABLET    Take 1 tablet (5 mg total) by mouth every 4 (four) hours as needed for breakthrough pain.       Purvis Sheffield, MD 10/03/13 [phone]

## 2013-10-17 ENCOUNTER — Emergency Department (HOSPITAL_COMMUNITY): Payer: Medicare Other

## 2013-10-17 ENCOUNTER — Emergency Department (HOSPITAL_COMMUNITY)
Admission: EM | Admit: 2013-10-17 | Discharge: 2013-10-17 | Disposition: A | Discharge status: Home / Self Care | Payer: Medicare Other | Attending: Emergency Medicine | Admitting: Emergency Medicine

## 2013-10-17 ENCOUNTER — Encounter (HOSPITAL_COMMUNITY): Payer: Self-pay | Admitting: Emergency Medicine

## 2013-10-17 DIAGNOSIS — I10 Essential (primary) hypertension: Secondary | ICD-10-CM | POA: Insufficient documentation

## 2013-10-17 DIAGNOSIS — F17200 Nicotine dependence, unspecified, uncomplicated: Secondary | ICD-10-CM | POA: Diagnosis not present

## 2013-10-17 DIAGNOSIS — E119 Type 2 diabetes mellitus without complications: Secondary | ICD-10-CM | POA: Diagnosis not present

## 2013-10-17 DIAGNOSIS — M545 Low back pain, unspecified: Secondary | ICD-10-CM | POA: Insufficient documentation

## 2013-10-17 DIAGNOSIS — Z87442 Personal history of urinary calculi: Secondary | ICD-10-CM | POA: Diagnosis not present

## 2013-10-17 DIAGNOSIS — Z79899 Other long term (current) drug therapy: Secondary | ICD-10-CM | POA: Diagnosis not present

## 2013-10-17 DIAGNOSIS — M5430 Sciatica, unspecified side: Secondary | ICD-10-CM | POA: Diagnosis not present

## 2013-10-17 DIAGNOSIS — M5441 Lumbago with sciatica, right side: Secondary | ICD-10-CM

## 2013-10-17 DIAGNOSIS — M549 Dorsalgia, unspecified: Secondary | ICD-10-CM | POA: Diagnosis not present

## 2013-10-17 DIAGNOSIS — IMO0002 Reserved for concepts with insufficient information to code with codable children: Secondary | ICD-10-CM | POA: Diagnosis not present

## 2013-10-17 MED ORDER — OXYCODONE-ACETAMINOPHEN 5-325 MG PO TABS: 1.0000 | ORAL_TABLET | ORAL | Status: DC | PRN

## 2013-10-17 MED ORDER — METHOCARBAMOL 500 MG PO TABS: 500.0000 mg | ORAL_TABLET | Freq: Four times a day (QID) | ORAL | Status: DC | PRN

## 2013-10-17 MED ORDER — HYDROMORPHONE HCL 1 MG/ML IJ SOLN
2.0000 mg | Freq: Once | INTRAMUSCULAR | Status: AC
  Administered 2013-10-17: 2 mg via INTRAMUSCULAR
  Filled 2013-10-17: qty 2

## 2013-10-17 NOTE — Discharge Instructions (Signed)
Read the information below.  Use the prescribed medication as directed.  Please discuss all new medications with your pharmacist.  Do not take additional tylenol while taking the prescribed pain medication to avoid overdose.  You may return to the Emergency Department at any time for worsening condition or any new symptoms that concern you.   If you develop fevers, loss of control of bowel or bladder, weakness or numbness in your legs, or are unable to walk, return to the ER for a recheck.    Back Pain, Adult Back pain is very common. The pain often gets better over time. The cause of back pain is usually not dangerous. Most people can learn to manage their back pain on their own.  HOME CARE   Stay active. Start with short walks on flat ground if you can. Try to walk farther each day.  Do not sit, drive, or stand in one place for more than 30 minutes. Do not stay in bed.  Do not avoid exercise or work. Activity can help your back heal faster.  Be careful when you bend or lift an object. Bend at your knees, keep the object close to you, and do not twist.  Sleep on a firm mattress. Lie on your side, and bend your knees. If you lie on your back, put a pillow under your knees.  Only take medicines as told by your doctor.  Put ice on the injured area.  Put ice in a plastic bag.  Place a towel between your skin and the bag.  Leave the ice on for 15-20 minutes, 03-04 times a day for the first 2 to 3 days. After that, you can switch between ice and heat packs.  Ask your doctor about back exercises or massage.  Avoid feeling anxious or stressed. Find good ways to deal with stress, such as exercise. GET HELP RIGHT AWAY IF:   Your pain does not go away with rest or medicine.  Your pain does not go away in 1 week.  You have new problems.  You do not feel well.  The pain spreads into your legs.  You cannot control when you poop (bowel movement) or pee (urinate).  Your arms or legs feel  weak or lose feeling (numbness).  You feel sick to your stomach (nauseous) or throw up (vomit).  You have belly (abdominal) pain.  You feel like you may pass out (faint). MAKE SURE YOU:   Understand these instructions.  Will watch your condition.  Will get help right away if you are not doing well or get worse. Document Released: 06/28/2007 Document Revised: 04/03/2011 Document Reviewed: 05/13/2013 [facility] Patient Information 2015 [place], [place]. This information is not intended to replace advice given to you by your health care provider. Make sure you discuss any questions you have with your health care provider.  Chronic Back Pain  When back pain lasts longer than 3 months, it is called chronic back pain.People with chronic back pain often go through certain periods that are more intense (flare-ups).  CAUSES Chronic back pain can be caused by wear and tear (degeneration) on different structures in your back. These structures include:  The bones of your spine (vertebrae) and the joints surrounding your spinal cord and nerve roots (facets).  The strong, fibrous tissues that connect your vertebrae (ligaments). Degeneration of these structures may result in pressure on your nerves. This can lead to constant pain. HOME CARE INSTRUCTIONS  Avoid bending, heavy lifting, prolonged sitting, and activities  which make the problem worse.  Take brief periods of rest throughout the day to reduce your pain. Lying down or standing usually is better than sitting while you are resting.  Take over-the-counter or prescription medicines only as directed by your caregiver. SEEK IMMEDIATE MEDICAL CARE IF:   You have weakness or numbness in one of your legs or feet.  You have trouble controlling your bladder or bowels.  You have nausea, vomiting, abdominal pain, shortness of breath, or fainting. Document Released: 02/17/2004 Document Revised: 04/03/2011 Document Reviewed: 12/24/2010 [facility]  Patient Information 2015 [place], [place]. This information is not intended to replace advice given to you by your health care provider. Make sure you discuss any questions you have with your health care provider.   Emergency Department Resource Guide 1) Find a Doctor and Pay Out of Pocket Although you won't have to find out who is covered by your insurance plan, it is a good idea to ask around and get recommendations. You will then need to call the office and see if the doctor you have chosen will accept you as a new patient and what types of options they offer for patients who are self-pay. Some doctors offer discounts or will set up payment plans for their patients who do not have insurance, but you will need to ask so you aren't surprised when you get to your appointment.  2) Contact Your Local Health Department Not all health departments have doctors that can see patients for sick visits, but many do, so it is worth a call to see if yours does. If you don't know where your local health department is, you can check in your phone book. The CDC also has a tool to help you locate your state's health department, and many state websites also have listings of all of their local health departments.  3) Find a Walk-in Clinic If your illness is not likely to be very severe or complicated, you may want to try a walk in clinic. These are popping up all over the country in pharmacies, drugstores, and shopping centers. They're usually staffed by nurse practitioners or physician assistants that have been trained to treat common illnesses and complaints. They're usually fairly quick and inexpensive. However, if you have serious medical issues or chronic medical problems, these are probably not your best option.  No Primary Care Doctor: - Call Health Connect at  [phone] - they can help you locate a primary care doctor that  accepts your insurance, provides certain services, etc. - Physician Referral Service-  [phone]  Chronic Pain Problems: Organization         Address  Phone   Notes  [facility]  [phone] Patients need to be referred by their primary care doctor.   Medication Assistance: Organization         Address  Phone   Notes  [facility] Medication [facility] [address] [place] [place], [place] [address] [phone] --Must be a resident of [facility] -- Must have NO insurance coverage whatsoever (no Medicaid/ Medicare, etc.) -- The pt. MUST have a primary care doctor that directs their care regularly and follows them in the community   [facility]ist  [phone]   [employer]  [phone]    Agencies that provide inexpensive medical care: Organization         Address  Phone   Notes  [facility]  [phone]   [facility]    (  [phone]) [phone]   [facility] [address] [place], [place] [address] [phone]) [phone]   [facility] [address], [place] [phone]) [phone]   Planned Parenthood    [phone]) [phone]   [facility]    [phone]) [phone]   [facility]  [address], [place] Phone:  [phone](336) [phone], Fax:  [phone](336) [phone] Hours of Operation:  9 am - 6 pm, M-F.  Also accepts Medicaid/Medicare and self-pay.  [facility]  [address], [place] Phone: [phone](336) [phone], Fax: [phone]) [phone]. Hours of Operation:  8:30 am - 5:30 pm, M-F.  Also accepts Medicaid and self-pay.  [facility] [place] [address], [place] Phone: [phone](336) [phone]   [facility] [address], [place], [place] [phone], Ext. [phone] Mondays & Thursdays: 7-9 AM.  First 15 patients are seen on a first come, first serve basis.    Medicaid-accepting [facility] Providers:  Organization         Address  Phone   Notes  [facility] [address],  [place] [phone]) [phone] Also accepts self-pay patients.  [facility] [address], [place]  [phone]) [phone]   [facility] [address], [place] [phone]) [phone]   [facility] [address] [place] [address], [place] [phone]) [phone]   Renaye RakersVeita Bland [address], [place]   [phone]) [phone] Only accepts [place] Access [place] patients after they have their name applied to their card.   Self-Pay (no insurance) in [facility]:  Organization         Address  Phone   Notes  Sickle Cell Patients, [facility] [address], [place] [phone]) [phone]   [facility] [address], [place] [phone]) [phone]   [facility] [place]  [address], [place] [phone]) [phone]   [facility]/Dr. Osei-Bonsu  [address] [place] [address], [place] or [id number], [place] [phone]) [phone] Phone number for both [place] and [place] locations is the same.  [facility] [address], [place] [phone]) [phone]   [facility] [address] [place] [address], [place] or [address] [phone](336) [phone](336) [phone]   [facility] [address], [place] [phone](336) [phone], phone; [phone](336) [phone], fax Sees patients 1st and 3rd Saturday of every month.  Must not qualify for public or private insurance (i.e. Medicaid, Medicare, [place] Health Choice, Veterans' Benefits)  Household income should be no more than 200% of the poverty level The clinic cannot treat you if you are pregnant or think you are pregnant  Sexually transmitted diseases are not treated at the clinic.    Dental Care: Organization         Address  Phone  Notes  [facility] [address], [place] [phone]) [phone] Accepts children up to age 38 who are enrolled in  [place] or [place] Health Choice; pregnant women with a Medicaid card; and children who have applied for Medicaid or  Health Choice, but were declined, whose parents can pay a reduced fee at time of service.  [facility] of [facility] [place]  [address], [place] [phone]) [phone] Accepts children up  to age 35 who are enrolled in Medicaid or [place] Health Choice; pregnant women with a Medicaid card; and children who have applied for Medicaid or [place] Health Choice, but were declined, whose parents can pay a reduced fee at time of service.  [facility]  [address] [place], [place] [phone] Patients are seen by appointment only. Walk-ins are not accepted. [facility] will see patients 27 years of age and older. Monday - Tuesday (8am-5pm) Most Wednesdays (8:30-5pm) $30 per visit, cash only  [facility] Adult Dental Access PROGRAM  [address], [facility] [phone] Patients are seen by appointment only. Walk-ins are not accepted. [facility] will see patients 14 years of age and older. One Wednesday Evening (Monthly: Volunteer Based).  $30 per visit, cash only  [employer]  [phone] for adults; Children under age 25, call Graduate Pediatric Dentistry at [phone]. Children aged 50-14, please call [phone] to request a pediatric application.  Dental services are provided in all areas of dental care including fillings, crowns and bridges, complete and partial dentures, implants, gum treatment, root canals, and extractions. Preventive care is also provided. Treatment is provided to both adults and children. Patients are selected via a lottery and there is often a waiting list.   [facility] [address] [place] [address], [place]  [phone] [email or web address]   [facility] [address] [place], [place] [phone], Ext. [phone] Second and Fourth Thursday of each month, opens at 6:30  AM; Clinic ends at 9 AM.  Patients are seen on a first-come first-served basis, and a limited number are seen during each clinic.   [facility]  [address] [place], [place] [phone]   Eligibility Requirements You must have lived in [place], [place], or [place] counties for at least the last three months.   You cannot be eligible for state or federal sponsored [place], including [employer], [place], or [employer].   You generally cannot be eligible for healthcare insurance through your employer.    How to apply: Eligibility screenings are held every Tuesday and Wednesday afternoon from 1:00 pm until 4:00 pm. You do not need an appointment for the interview!  [facility] [address], [place], [place] [phone]   [facility] Health Department  [phone]   [facility] Health Department  [phone]   [facility] Health Department  [phone]    Behavioral Health Resources in the Community: Intensive Outpatient Programs Organization         Address  Phone  Notes  [facility] [address] [place], [place] [phone]   [facility] [address], [place], [place] [phone]   [facility] [address] [place], [place]  [phone]   [facility] Mental Health [address]  [place], [place] [phone] or [phone]   Substance Abuse Resources Organization         Address  Phone  Notes  [facility] Drug Services  [phone]   [facility]  [phone]   The [place]  [phone]   Floydene Flock  [phone]   Residential & Outpatient Substance Abuse Program  [phone]   Psychological Services Organization         Address  Phone  Notes  [facility] Behavioral Health  [phone]   [facility]  [phone]   [facility] Mental Health [phone]  [address], [place] [phone] or  [phone]    Mobile Crisis Teams Organization         Address  Phone  Notes  [facility], Mobile Crisis Care Unit  [phone]   [facility]  [address] [place], [place] [phone]   Doristine Locks [address] [place] [phone]    Self-Help/Support Groups Organization         Address  Phone             Notes  [facility] [place] - variety of support groups  336- I7437963 Call for more information  Narcotics Anonymous (NA), [facility] [address], [employer] Page  2 meetings at this location   Statistician         Address  Phone  Notes  [facility] [address],    [place]  [phone]   [facility]  [address] [place] [address], [place], [place] [phone]   [facility] [address] [place], [place] [phone] Admissions: 8am-3pm M-F  [facility] [address],    [place], [place] [phone]   [facility] [address] [place], [place], [place] [phone]   [facility] [address],  [place], [place] [phone]   [facility] - Intensive Outpatient [address], [place], [place] [phone]   [facility] ([facility] [address] [place].,  [place], [place] [phone] or [phone]   [facility] ([facility]) [address], [place], [place] [phone] Accepts Medicaid  [facility] [place] [address]  [place] [phone] Substance Abuse/Addiction Treatment   [facility] Organization         Address  Phone  Notes  [facility]  [phone]   Angie Fava, PhD [address] [place], [place]   [phone] or [phone]   [facility]   [address] [place], [place] [phone]   [facility] [address],  [place], [place] [phone] Insurance/Medicaid/sponsorship through [facility] Families [address]                                    [place], [place] [phone] Therapy/tele-psych/case  [facility] [address], [place] [phone]    Dr. Lolly Mustache  [phone]   [facility] [place]  [employer] [facility] Dept. 1) [address], [place] 2) [address], [place] 3)  [address], [place] [phone]  [phone]   [facility] Child Abuse Hotline 609-746-6590 or 712-494-2339 (After Hours)

## 2013-10-17 NOTE — ED Provider Notes (Signed)
CSN: [id number]     Arrival date & time 10/17/13  1248 History   First MD Initiated Contact with Patient 10/17/13 1331     Chief Complaint  Patient presents with  . Back Pain     (Consider location/radiation/quality/duration/timing/severity/associated sxs/prior Treatment) HPI  Patient presents with exacerbation of his chronic back pain with radiation into both legs. He has a history of lumbar surgery with Dr. Wyline Mood.  Last night, patient states his knee gave out and he fell forward onto his knee then to the ground.  Denies hitting head or LOC.  Pain is in his low back radiates down his right leg worse than usual, also has tingling in the right lateral thigh. The pain in his back is described as aching and pressure, worse with lying flat and walking, better with sitting. The pain in his leg is new since the fall it is described as tingling and sharp.  States pain in his knees is chronic and unchanged, his knee "giving out" on him occurs occasionally.   Denies fevers, chills, abdominal pain, loss of control of bowel or bladder, weakness of numbness of the extremities, saddle anesthesia, bowel or urinary complaints.     Past Medical History  Diagnosis Date  . Kidney stone   . Hypertension   . Diabetes mellitus without complication    Past Surgical History  Procedure Laterality Date  . Cholecystectomy    . Back surgery     No family history on file. History  Substance Use Topics  . Smoking status: Current Every Day Smoker -- 0.50 packs/day    Types: Cigarettes  . Smokeless tobacco: Not on file  . Alcohol Use: No    Review of Systems  All other systems reviewed and are negative.     Allergies  Review of patient's allergies indicates no known allergies.  Home Medications   Prior to Admission medications   Medication Sig Start Date End Date Taking? Authorizing Provider  atenolol (TENORMIN) 25 MG tablet Take 25 mg by mouth daily.  09/01/13  Yes Historical Provider, MD    gabapentin (NEURONTIN) 600 MG tablet Take 600 mg by mouth 2 (two) times daily.  09/09/13  Yes Historical Provider, MD  metFORMIN (GLUCOPHAGE) 500 MG tablet Take 500 mg by mouth 2 (two) times daily with a meal.   Yes Historical Provider, MD  methocarbamol (ROBAXIN) 500 MG tablet Take 1 tablet (500 mg total) by mouth 2 (two) times daily. 09/21/13  Yes Jennifer L Piepenbrink, PA-C  Oxycodone HCl 10 MG TABS Take 10 mg by mouth 3 (three) times daily as needed (for pain).  09/12/13  Yes Historical Provider, MD  traZODone (DESYREL) 150 MG tablet Take 150 mg by mouth at bedtime.  08/21/13  Yes Historical Provider, MD   BP 158/92  Pulse 84  Temp(Src) 98.5 F (36.9 C) (Oral)  Resp 16  Ht  (1.753 m)  Wt 248 lb (112.492 kg)  BMI 36.61 kg/m2  SpO2 99% Physical Exam  Nursing note and vitals reviewed. Constitutional: He appears well-developed and well-nourished. No distress.  HENT:  Head: Normocephalic and atraumatic.  Neck: Neck supple.  Pulmonary/Chest: Effort normal.  Abdominal: Soft. There is no tenderness.  Musculoskeletal:       Back:  Lower extremities: distal pulses intact, sensation intact, right leg 4/5 strength (?secondary to pain), left 5/5 strength.   Neurological: He is alert.  Skin: He is not diaphoretic.  Psychiatric: He has a normal mood and affect.    ED  Course  Procedures (including critical care time) Labs Review Labs Reviewed - No data to display  Imaging Review Dg Thoracic Spine 2 View  10/17/2013   CLINICAL DATA:  Larey Seat last night.  Back pain.  EXAM: THORACIC SPINE - 2 VIEW  COMPARISON:  CT, 09/21/2013  FINDINGS: No fracture. No spondylolisthesis. There are degenerative changes most evident along the lower thoracic spine with mild loss of disc height and endplate spurring. No bone lesion. Soft tissues are unremarkable.  IMPRESSION: No fracture or acute finding.   Electronically Signed   By: Amie Portland M.D.   On: 10/17/2013 15:39   Dg Lumbar Spine  Complete  10/17/2013   CLINICAL DATA:  Recent fall with low back pain  EXAM: LUMBAR SPINE - COMPLETE 4+ VIEW  COMPARISON:  None.  FINDINGS: Vertebral body height is well maintained. No pars defects are spondylolisthesis is noted. Mild disc space narrowing is noted at L3-4 and L4-5. Mild osteophytic changes are seen.  IMPRESSION: Mild degenerative change without acute abnormality.   Electronically Signed   By: Alcide Clever M.D.   On: 10/17/2013 15:38     EKG Interpretation None     Pt checked on DEA database, no recent prescriptions found.  4:10 PM Reexamination shows 5/5 strength of RLE and normal gait after pain medication.   MDM   Final diagnoses:  Bilateral low back pain with right-sided sciatica   Afebrile, nontoxic patient with chronic low back pain with radiation into both legs.  Fall yesterday with increased pain and increased radiculopathy on right.  When pain controlled strength and gait are normal. No red flags.  Xrays negative.  D/C home with PCP follow up.  Pt also has neurosurgeon in [facility] that he can follow up with.  Discussed result, findings, treatment, and follow up  with patient.  Pt given return precautions.  Pt verbalizes understanding and agrees with plan.         Trixie Dredge, PA-C 10/17/13 1635

## 2013-10-17 NOTE — ED Provider Notes (Signed)
Medical screening examination/treatment/procedure(s) were performed by non-physician practitioner and as supervising physician I was immediately available for consultation/collaboration.    Shelsey Rieth D Eron Staat, MD 10/17/13 1910 

## 2013-10-17 NOTE — ED Notes (Signed)
Pt reports his leg gave out last night while walking and now has low back pain radiating down right leg. Pt ambulatory, NAD at present.

## 2013-10-22 DIAGNOSIS — G4700 Insomnia, unspecified: Secondary | ICD-10-CM | POA: Diagnosis not present

## 2013-10-22 DIAGNOSIS — E119 Type 2 diabetes mellitus without complications: Secondary | ICD-10-CM | POA: Diagnosis not present

## 2013-10-22 DIAGNOSIS — IMO0001 Reserved for inherently not codable concepts without codable children: Secondary | ICD-10-CM | POA: Diagnosis not present

## 2013-10-22 DIAGNOSIS — R5383 Other fatigue: Secondary | ICD-10-CM | POA: Diagnosis not present

## 2013-10-22 DIAGNOSIS — Z79899 Other long term (current) drug therapy: Secondary | ICD-10-CM | POA: Diagnosis not present

## 2013-10-22 DIAGNOSIS — E7800 Pure hypercholesterolemia, unspecified: Secondary | ICD-10-CM | POA: Diagnosis not present

## 2013-10-22 DIAGNOSIS — R5381 Other malaise: Secondary | ICD-10-CM | POA: Diagnosis not present

## 2013-12-02 DIAGNOSIS — F329 Major depressive disorder, single episode, unspecified: Secondary | ICD-10-CM | POA: Diagnosis not present

## 2013-12-03 DIAGNOSIS — F332 Major depressive disorder, recurrent severe without psychotic features: Secondary | ICD-10-CM | POA: Diagnosis not present

## 2013-12-04 DIAGNOSIS — F332 Major depressive disorder, recurrent severe without psychotic features: Secondary | ICD-10-CM | POA: Diagnosis not present

## 2013-12-05 DIAGNOSIS — F332 Major depressive disorder, recurrent severe without psychotic features: Secondary | ICD-10-CM | POA: Diagnosis not present

## 2013-12-09 DIAGNOSIS — F339 Major depressive disorder, recurrent, unspecified: Secondary | ICD-10-CM | POA: Diagnosis not present

## 2013-12-30 DIAGNOSIS — M129 Arthropathy, unspecified: Secondary | ICD-10-CM | POA: Diagnosis not present

## 2013-12-30 DIAGNOSIS — M5134 Other intervertebral disc degeneration, thoracic region: Secondary | ICD-10-CM | POA: Diagnosis not present

## 2013-12-30 DIAGNOSIS — I10 Essential (primary) hypertension: Secondary | ICD-10-CM | POA: Diagnosis not present

## 2013-12-30 DIAGNOSIS — K210 Gastro-esophageal reflux disease with esophagitis: Secondary | ICD-10-CM | POA: Diagnosis not present

## 2014-01-12 DIAGNOSIS — F339 Major depressive disorder, recurrent, unspecified: Secondary | ICD-10-CM | POA: Diagnosis not present

## 2014-01-27 DIAGNOSIS — I10 Essential (primary) hypertension: Secondary | ICD-10-CM | POA: Diagnosis not present

## 2014-01-27 DIAGNOSIS — E119 Type 2 diabetes mellitus without complications: Secondary | ICD-10-CM | POA: Diagnosis not present

## 2014-01-27 DIAGNOSIS — G8929 Other chronic pain: Secondary | ICD-10-CM | POA: Diagnosis not present

## 2014-01-27 DIAGNOSIS — F419 Anxiety disorder, unspecified: Secondary | ICD-10-CM | POA: Diagnosis not present

## 2014-02-26 DIAGNOSIS — I10 Essential (primary) hypertension: Secondary | ICD-10-CM | POA: Diagnosis not present

## 2014-02-26 DIAGNOSIS — G8929 Other chronic pain: Secondary | ICD-10-CM | POA: Diagnosis not present

## 2014-02-26 DIAGNOSIS — F419 Anxiety disorder, unspecified: Secondary | ICD-10-CM | POA: Diagnosis not present

## 2014-03-08 DIAGNOSIS — R05 Cough: Secondary | ICD-10-CM | POA: Diagnosis not present

## 2014-03-08 DIAGNOSIS — J209 Acute bronchitis, unspecified: Secondary | ICD-10-CM | POA: Diagnosis not present

## 2014-03-26 DIAGNOSIS — G4700 Insomnia, unspecified: Secondary | ICD-10-CM | POA: Diagnosis not present

## 2014-03-26 DIAGNOSIS — I10 Essential (primary) hypertension: Secondary | ICD-10-CM | POA: Diagnosis not present

## 2014-03-26 DIAGNOSIS — R5382 Chronic fatigue, unspecified: Secondary | ICD-10-CM | POA: Diagnosis not present

## 2014-03-26 DIAGNOSIS — G894 Chronic pain syndrome: Secondary | ICD-10-CM | POA: Diagnosis not present

## 2014-04-27 DIAGNOSIS — J449 Chronic obstructive pulmonary disease, unspecified: Secondary | ICD-10-CM | POA: Diagnosis not present

## 2014-04-27 DIAGNOSIS — R5382 Chronic fatigue, unspecified: Secondary | ICD-10-CM | POA: Diagnosis not present

## 2014-04-27 DIAGNOSIS — G894 Chronic pain syndrome: Secondary | ICD-10-CM | POA: Diagnosis not present

## 2014-04-27 DIAGNOSIS — J019 Acute sinusitis, unspecified: Secondary | ICD-10-CM | POA: Diagnosis not present

## 2014-04-27 DIAGNOSIS — G4700 Insomnia, unspecified: Secondary | ICD-10-CM | POA: Diagnosis not present

## 2014-05-04 DIAGNOSIS — F339 Major depressive disorder, recurrent, unspecified: Secondary | ICD-10-CM | POA: Diagnosis not present

## 2014-05-26 DIAGNOSIS — G894 Chronic pain syndrome: Secondary | ICD-10-CM | POA: Diagnosis not present

## 2014-05-26 DIAGNOSIS — G4700 Insomnia, unspecified: Secondary | ICD-10-CM | POA: Diagnosis not present

## 2014-05-26 DIAGNOSIS — M129 Arthropathy, unspecified: Secondary | ICD-10-CM | POA: Diagnosis not present

## 2014-05-26 DIAGNOSIS — M5134 Other intervertebral disc degeneration, thoracic region: Secondary | ICD-10-CM | POA: Diagnosis not present

## 2014-05-26 DIAGNOSIS — R5382 Chronic fatigue, unspecified: Secondary | ICD-10-CM | POA: Diagnosis not present

## 2014-05-26 DIAGNOSIS — I10 Essential (primary) hypertension: Secondary | ICD-10-CM | POA: Diagnosis not present

## 2014-05-30 DIAGNOSIS — E876 Hypokalemia: Secondary | ICD-10-CM | POA: Diagnosis not present

## 2014-05-30 DIAGNOSIS — M6282 Rhabdomyolysis: Secondary | ICD-10-CM | POA: Diagnosis not present

## 2014-05-30 DIAGNOSIS — S90852A Superficial foreign body, left foot, initial encounter: Secondary | ICD-10-CM | POA: Diagnosis not present

## 2014-05-30 DIAGNOSIS — M7989 Other specified soft tissue disorders: Secondary | ICD-10-CM | POA: Diagnosis not present

## 2014-05-30 DIAGNOSIS — I361 Nonrheumatic tricuspid (valve) insufficiency: Secondary | ICD-10-CM | POA: Diagnosis not present

## 2014-05-30 DIAGNOSIS — L03119 Cellulitis of unspecified part of limb: Secondary | ICD-10-CM | POA: Diagnosis not present

## 2014-05-30 DIAGNOSIS — R509 Fever, unspecified: Secondary | ICD-10-CM | POA: Diagnosis not present

## 2014-05-30 DIAGNOSIS — M79672 Pain in left foot: Secondary | ICD-10-CM | POA: Diagnosis not present

## 2014-05-30 DIAGNOSIS — S99912A Unspecified injury of left ankle, initial encounter: Secondary | ICD-10-CM | POA: Diagnosis not present

## 2014-05-30 DIAGNOSIS — L03116 Cellulitis of left lower limb: Secondary | ICD-10-CM | POA: Diagnosis not present

## 2014-05-30 DIAGNOSIS — S299XXA Unspecified injury of thorax, initial encounter: Secondary | ICD-10-CM | POA: Diagnosis not present

## 2014-05-30 DIAGNOSIS — I517 Cardiomegaly: Secondary | ICD-10-CM | POA: Diagnosis not present

## 2014-05-30 DIAGNOSIS — L02612 Cutaneous abscess of left foot: Secondary | ICD-10-CM | POA: Diagnosis not present

## 2014-05-30 DIAGNOSIS — S99922A Unspecified injury of left foot, initial encounter: Secondary | ICD-10-CM | POA: Diagnosis not present

## 2014-05-30 DIAGNOSIS — A419 Sepsis, unspecified organism: Secondary | ICD-10-CM | POA: Diagnosis not present

## 2014-05-30 DIAGNOSIS — M795 Residual foreign body in soft tissue: Secondary | ICD-10-CM | POA: Diagnosis not present

## 2014-05-31 DIAGNOSIS — S91322A Laceration with foreign body, left foot, initial encounter: Secondary | ICD-10-CM | POA: Diagnosis not present

## 2014-05-31 DIAGNOSIS — L03119 Cellulitis of unspecified part of limb: Secondary | ICD-10-CM | POA: Diagnosis not present

## 2014-05-31 DIAGNOSIS — E876 Hypokalemia: Secondary | ICD-10-CM | POA: Diagnosis not present

## 2014-05-31 DIAGNOSIS — I10 Essential (primary) hypertension: Secondary | ICD-10-CM | POA: Diagnosis not present

## 2014-05-31 DIAGNOSIS — I999 Unspecified disorder of circulatory system: Secondary | ICD-10-CM | POA: Diagnosis not present

## 2014-05-31 DIAGNOSIS — A419 Sepsis, unspecified organism: Secondary | ICD-10-CM | POA: Diagnosis not present

## 2014-05-31 DIAGNOSIS — M6282 Rhabdomyolysis: Secondary | ICD-10-CM | POA: Diagnosis not present

## 2014-05-31 DIAGNOSIS — M19072 Primary osteoarthritis, left ankle and foot: Secondary | ICD-10-CM | POA: Diagnosis not present

## 2014-06-01 DIAGNOSIS — R7989 Other specified abnormal findings of blood chemistry: Secondary | ICD-10-CM | POA: Diagnosis not present

## 2014-06-01 DIAGNOSIS — L03119 Cellulitis of unspecified part of limb: Secondary | ICD-10-CM | POA: Diagnosis not present

## 2014-06-01 DIAGNOSIS — I10 Essential (primary) hypertension: Secondary | ICD-10-CM | POA: Diagnosis not present

## 2014-06-01 DIAGNOSIS — L0390 Cellulitis, unspecified: Secondary | ICD-10-CM | POA: Diagnosis not present

## 2014-06-01 DIAGNOSIS — A419 Sepsis, unspecified organism: Secondary | ICD-10-CM | POA: Diagnosis not present

## 2014-06-01 DIAGNOSIS — I999 Unspecified disorder of circulatory system: Secondary | ICD-10-CM | POA: Diagnosis not present

## 2014-06-02 DIAGNOSIS — B182 Chronic viral hepatitis C: Secondary | ICD-10-CM | POA: Diagnosis not present

## 2014-06-02 DIAGNOSIS — L0390 Cellulitis, unspecified: Secondary | ICD-10-CM | POA: Diagnosis not present

## 2014-06-02 DIAGNOSIS — R160 Hepatomegaly, not elsewhere classified: Secondary | ICD-10-CM | POA: Diagnosis not present

## 2014-06-02 DIAGNOSIS — A419 Sepsis, unspecified organism: Secondary | ICD-10-CM | POA: Diagnosis not present

## 2014-06-02 DIAGNOSIS — L03119 Cellulitis of unspecified part of limb: Secondary | ICD-10-CM | POA: Diagnosis not present

## 2014-06-02 DIAGNOSIS — R7881 Bacteremia: Secondary | ICD-10-CM | POA: Diagnosis not present

## 2014-06-02 DIAGNOSIS — J90 Pleural effusion, not elsewhere classified: Secondary | ICD-10-CM | POA: Diagnosis not present

## 2014-06-02 DIAGNOSIS — I999 Unspecified disorder of circulatory system: Secondary | ICD-10-CM | POA: Diagnosis not present

## 2014-06-02 DIAGNOSIS — I10 Essential (primary) hypertension: Secondary | ICD-10-CM | POA: Diagnosis not present

## 2014-06-02 DIAGNOSIS — J9811 Atelectasis: Secondary | ICD-10-CM | POA: Diagnosis not present

## 2014-06-03 DIAGNOSIS — L0390 Cellulitis, unspecified: Secondary | ICD-10-CM | POA: Diagnosis not present

## 2014-06-03 DIAGNOSIS — I999 Unspecified disorder of circulatory system: Secondary | ICD-10-CM | POA: Diagnosis not present

## 2014-06-03 DIAGNOSIS — A419 Sepsis, unspecified organism: Secondary | ICD-10-CM | POA: Diagnosis not present

## 2014-06-03 DIAGNOSIS — L02612 Cutaneous abscess of left foot: Secondary | ICD-10-CM | POA: Diagnosis not present

## 2014-06-03 DIAGNOSIS — B182 Chronic viral hepatitis C: Secondary | ICD-10-CM | POA: Diagnosis not present

## 2014-06-03 DIAGNOSIS — I10 Essential (primary) hypertension: Secondary | ICD-10-CM | POA: Diagnosis not present

## 2014-06-03 DIAGNOSIS — L03119 Cellulitis of unspecified part of limb: Secondary | ICD-10-CM | POA: Diagnosis not present

## 2014-06-03 DIAGNOSIS — R7881 Bacteremia: Secondary | ICD-10-CM | POA: Diagnosis not present

## 2014-06-03 DIAGNOSIS — D72823 Leukemoid reaction: Secondary | ICD-10-CM | POA: Diagnosis not present

## 2014-06-04 DIAGNOSIS — I10 Essential (primary) hypertension: Secondary | ICD-10-CM | POA: Diagnosis not present

## 2014-06-04 DIAGNOSIS — D72823 Leukemoid reaction: Secondary | ICD-10-CM | POA: Diagnosis not present

## 2014-06-04 DIAGNOSIS — L0390 Cellulitis, unspecified: Secondary | ICD-10-CM | POA: Diagnosis not present

## 2014-06-04 DIAGNOSIS — B182 Chronic viral hepatitis C: Secondary | ICD-10-CM | POA: Diagnosis not present

## 2014-06-07 DIAGNOSIS — D72823 Leukemoid reaction: Secondary | ICD-10-CM | POA: Diagnosis not present

## 2014-06-07 DIAGNOSIS — L0390 Cellulitis, unspecified: Secondary | ICD-10-CM | POA: Diagnosis not present

## 2014-06-07 DIAGNOSIS — B182 Chronic viral hepatitis C: Secondary | ICD-10-CM | POA: Diagnosis not present

## 2014-06-07 DIAGNOSIS — I10 Essential (primary) hypertension: Secondary | ICD-10-CM | POA: Diagnosis not present

## 2014-06-08 DIAGNOSIS — I10 Essential (primary) hypertension: Secondary | ICD-10-CM | POA: Diagnosis not present

## 2014-06-08 DIAGNOSIS — L0390 Cellulitis, unspecified: Secondary | ICD-10-CM | POA: Diagnosis not present

## 2014-06-08 DIAGNOSIS — D72823 Leukemoid reaction: Secondary | ICD-10-CM | POA: Diagnosis not present

## 2014-06-08 DIAGNOSIS — B182 Chronic viral hepatitis C: Secondary | ICD-10-CM | POA: Diagnosis not present

## 2014-06-09 DIAGNOSIS — I999 Unspecified disorder of circulatory system: Secondary | ICD-10-CM | POA: Diagnosis not present

## 2014-06-09 DIAGNOSIS — L03119 Cellulitis of unspecified part of limb: Secondary | ICD-10-CM | POA: Diagnosis not present

## 2014-06-09 DIAGNOSIS — I10 Essential (primary) hypertension: Secondary | ICD-10-CM | POA: Diagnosis not present

## 2014-06-09 DIAGNOSIS — A419 Sepsis, unspecified organism: Secondary | ICD-10-CM | POA: Diagnosis not present

## 2014-06-09 DIAGNOSIS — D72823 Leukemoid reaction: Secondary | ICD-10-CM | POA: Diagnosis not present

## 2014-06-09 DIAGNOSIS — L0390 Cellulitis, unspecified: Secondary | ICD-10-CM | POA: Diagnosis not present

## 2014-06-09 DIAGNOSIS — B182 Chronic viral hepatitis C: Secondary | ICD-10-CM | POA: Diagnosis not present

## 2014-06-10 DIAGNOSIS — M1990 Unspecified osteoarthritis, unspecified site: Secondary | ICD-10-CM | POA: Diagnosis present

## 2014-06-10 DIAGNOSIS — A4102 Sepsis due to Methicillin resistant Staphylococcus aureus: Secondary | ICD-10-CM | POA: Diagnosis present

## 2014-06-10 DIAGNOSIS — R1011 Right upper quadrant pain: Secondary | ICD-10-CM | POA: Diagnosis not present

## 2014-06-10 DIAGNOSIS — B999 Unspecified infectious disease: Secondary | ICD-10-CM | POA: Diagnosis not present

## 2014-06-10 DIAGNOSIS — K6389 Other specified diseases of intestine: Secondary | ICD-10-CM | POA: Diagnosis not present

## 2014-06-10 DIAGNOSIS — L03319 Cellulitis of trunk, unspecified: Secondary | ICD-10-CM | POA: Diagnosis not present

## 2014-06-10 DIAGNOSIS — R160 Hepatomegaly, not elsewhere classified: Secondary | ICD-10-CM | POA: Diagnosis not present

## 2014-06-10 DIAGNOSIS — T8189XA Other complications of procedures, not elsewhere classified, initial encounter: Secondary | ICD-10-CM | POA: Diagnosis not present

## 2014-06-10 DIAGNOSIS — F419 Anxiety disorder, unspecified: Secondary | ICD-10-CM | POA: Diagnosis not present

## 2014-06-10 DIAGNOSIS — G894 Chronic pain syndrome: Secondary | ICD-10-CM | POA: Diagnosis present

## 2014-06-10 DIAGNOSIS — D72823 Leukemoid reaction: Secondary | ICD-10-CM | POA: Diagnosis not present

## 2014-06-10 DIAGNOSIS — N1330 Unspecified hydronephrosis: Secondary | ICD-10-CM | POA: Diagnosis not present

## 2014-06-10 DIAGNOSIS — I2510 Atherosclerotic heart disease of native coronary artery without angina pectoris: Secondary | ICD-10-CM | POA: Diagnosis not present

## 2014-06-10 DIAGNOSIS — L0390 Cellulitis, unspecified: Secondary | ICD-10-CM | POA: Diagnosis not present

## 2014-06-10 DIAGNOSIS — R748 Abnormal levels of other serum enzymes: Secondary | ICD-10-CM | POA: Diagnosis not present

## 2014-06-10 DIAGNOSIS — E785 Hyperlipidemia, unspecified: Secondary | ICD-10-CM | POA: Diagnosis present

## 2014-06-10 DIAGNOSIS — B182 Chronic viral hepatitis C: Secondary | ICD-10-CM | POA: Diagnosis not present

## 2014-06-10 DIAGNOSIS — F331 Major depressive disorder, recurrent, moderate: Secondary | ICD-10-CM | POA: Diagnosis not present

## 2014-06-10 DIAGNOSIS — F17200 Nicotine dependence, unspecified, uncomplicated: Secondary | ICD-10-CM | POA: Diagnosis present

## 2014-06-10 DIAGNOSIS — I10 Essential (primary) hypertension: Secondary | ICD-10-CM | POA: Diagnosis not present

## 2014-06-10 DIAGNOSIS — F603 Borderline personality disorder: Secondary | ICD-10-CM | POA: Diagnosis present

## 2014-06-10 DIAGNOSIS — K219 Gastro-esophageal reflux disease without esophagitis: Secondary | ICD-10-CM | POA: Diagnosis present

## 2014-06-10 DIAGNOSIS — L03116 Cellulitis of left lower limb: Secondary | ICD-10-CM | POA: Diagnosis present

## 2014-06-10 DIAGNOSIS — F209 Schizophrenia, unspecified: Secondary | ICD-10-CM | POA: Diagnosis present

## 2014-06-10 DIAGNOSIS — E119 Type 2 diabetes mellitus without complications: Secondary | ICD-10-CM | POA: Diagnosis not present

## 2014-06-10 DIAGNOSIS — R6520 Severe sepsis without septic shock: Secondary | ICD-10-CM | POA: Diagnosis not present

## 2014-06-10 DIAGNOSIS — F329 Major depressive disorder, single episode, unspecified: Secondary | ICD-10-CM | POA: Diagnosis present

## 2014-06-27 DIAGNOSIS — R160 Hepatomegaly, not elsewhere classified: Secondary | ICD-10-CM | POA: Diagnosis not present

## 2014-06-27 DIAGNOSIS — R748 Abnormal levels of other serum enzymes: Secondary | ICD-10-CM | POA: Diagnosis not present

## 2014-06-27 DIAGNOSIS — K6389 Other specified diseases of intestine: Secondary | ICD-10-CM | POA: Diagnosis not present

## 2014-06-27 DIAGNOSIS — N1330 Unspecified hydronephrosis: Secondary | ICD-10-CM | POA: Diagnosis not present

## 2014-06-30 DIAGNOSIS — E119 Type 2 diabetes mellitus without complications: Secondary | ICD-10-CM | POA: Diagnosis not present

## 2014-06-30 DIAGNOSIS — L02612 Cutaneous abscess of left foot: Secondary | ICD-10-CM | POA: Diagnosis not present

## 2014-06-30 DIAGNOSIS — I10 Essential (primary) hypertension: Secondary | ICD-10-CM | POA: Diagnosis not present

## 2014-06-30 DIAGNOSIS — F209 Schizophrenia, unspecified: Secondary | ICD-10-CM | POA: Diagnosis not present

## 2014-06-30 DIAGNOSIS — B182 Chronic viral hepatitis C: Secondary | ICD-10-CM | POA: Diagnosis not present

## 2014-06-30 DIAGNOSIS — I2510 Atherosclerotic heart disease of native coronary artery without angina pectoris: Secondary | ICD-10-CM | POA: Diagnosis not present

## 2014-07-01 DIAGNOSIS — I10 Essential (primary) hypertension: Secondary | ICD-10-CM | POA: Diagnosis not present

## 2014-07-01 DIAGNOSIS — B182 Chronic viral hepatitis C: Secondary | ICD-10-CM | POA: Diagnosis not present

## 2014-07-01 DIAGNOSIS — L02612 Cutaneous abscess of left foot: Secondary | ICD-10-CM | POA: Diagnosis not present

## 2014-07-01 DIAGNOSIS — F209 Schizophrenia, unspecified: Secondary | ICD-10-CM | POA: Diagnosis not present

## 2014-07-01 DIAGNOSIS — E119 Type 2 diabetes mellitus without complications: Secondary | ICD-10-CM | POA: Diagnosis not present

## 2014-07-01 DIAGNOSIS — I2510 Atherosclerotic heart disease of native coronary artery without angina pectoris: Secondary | ICD-10-CM | POA: Diagnosis not present

## 2014-07-02 DIAGNOSIS — E119 Type 2 diabetes mellitus without complications: Secondary | ICD-10-CM | POA: Diagnosis not present

## 2014-07-02 DIAGNOSIS — I10 Essential (primary) hypertension: Secondary | ICD-10-CM | POA: Diagnosis not present

## 2014-07-02 DIAGNOSIS — L02612 Cutaneous abscess of left foot: Secondary | ICD-10-CM | POA: Diagnosis not present

## 2014-07-02 DIAGNOSIS — F209 Schizophrenia, unspecified: Secondary | ICD-10-CM | POA: Diagnosis not present

## 2014-07-02 DIAGNOSIS — I2510 Atherosclerotic heart disease of native coronary artery without angina pectoris: Secondary | ICD-10-CM | POA: Diagnosis not present

## 2014-07-02 DIAGNOSIS — B182 Chronic viral hepatitis C: Secondary | ICD-10-CM | POA: Diagnosis not present

## 2014-07-03 DIAGNOSIS — F209 Schizophrenia, unspecified: Secondary | ICD-10-CM | POA: Diagnosis not present

## 2014-07-03 DIAGNOSIS — L02612 Cutaneous abscess of left foot: Secondary | ICD-10-CM | POA: Diagnosis not present

## 2014-07-03 DIAGNOSIS — I10 Essential (primary) hypertension: Secondary | ICD-10-CM | POA: Diagnosis not present

## 2014-07-03 DIAGNOSIS — I2510 Atherosclerotic heart disease of native coronary artery without angina pectoris: Secondary | ICD-10-CM | POA: Diagnosis not present

## 2014-07-03 DIAGNOSIS — B182 Chronic viral hepatitis C: Secondary | ICD-10-CM | POA: Diagnosis not present

## 2014-07-03 DIAGNOSIS — E119 Type 2 diabetes mellitus without complications: Secondary | ICD-10-CM | POA: Diagnosis not present

## 2014-07-06 DIAGNOSIS — L02612 Cutaneous abscess of left foot: Secondary | ICD-10-CM | POA: Diagnosis not present

## 2014-07-06 DIAGNOSIS — E119 Type 2 diabetes mellitus without complications: Secondary | ICD-10-CM | POA: Diagnosis not present

## 2014-07-06 DIAGNOSIS — I2510 Atherosclerotic heart disease of native coronary artery without angina pectoris: Secondary | ICD-10-CM | POA: Diagnosis not present

## 2014-07-06 DIAGNOSIS — F209 Schizophrenia, unspecified: Secondary | ICD-10-CM | POA: Diagnosis not present

## 2014-07-06 DIAGNOSIS — I10 Essential (primary) hypertension: Secondary | ICD-10-CM | POA: Diagnosis not present

## 2014-07-06 DIAGNOSIS — B182 Chronic viral hepatitis C: Secondary | ICD-10-CM | POA: Diagnosis not present

## 2014-07-07 DIAGNOSIS — I2510 Atherosclerotic heart disease of native coronary artery without angina pectoris: Secondary | ICD-10-CM | POA: Diagnosis not present

## 2014-07-07 DIAGNOSIS — E119 Type 2 diabetes mellitus without complications: Secondary | ICD-10-CM | POA: Diagnosis not present

## 2014-07-07 DIAGNOSIS — F209 Schizophrenia, unspecified: Secondary | ICD-10-CM | POA: Diagnosis not present

## 2014-07-07 DIAGNOSIS — L02612 Cutaneous abscess of left foot: Secondary | ICD-10-CM | POA: Diagnosis not present

## 2014-07-07 DIAGNOSIS — B182 Chronic viral hepatitis C: Secondary | ICD-10-CM | POA: Diagnosis not present

## 2014-07-07 DIAGNOSIS — I10 Essential (primary) hypertension: Secondary | ICD-10-CM | POA: Diagnosis not present

## 2014-07-09 DIAGNOSIS — F209 Schizophrenia, unspecified: Secondary | ICD-10-CM | POA: Diagnosis not present

## 2014-07-09 DIAGNOSIS — I10 Essential (primary) hypertension: Secondary | ICD-10-CM | POA: Diagnosis not present

## 2014-07-09 DIAGNOSIS — B182 Chronic viral hepatitis C: Secondary | ICD-10-CM | POA: Diagnosis not present

## 2014-07-09 DIAGNOSIS — R5382 Chronic fatigue, unspecified: Secondary | ICD-10-CM | POA: Diagnosis not present

## 2014-07-09 DIAGNOSIS — G894 Chronic pain syndrome: Secondary | ICD-10-CM | POA: Diagnosis not present

## 2014-07-09 DIAGNOSIS — L02612 Cutaneous abscess of left foot: Secondary | ICD-10-CM | POA: Diagnosis not present

## 2014-07-09 DIAGNOSIS — G4700 Insomnia, unspecified: Secondary | ICD-10-CM | POA: Diagnosis not present

## 2014-07-09 DIAGNOSIS — I2510 Atherosclerotic heart disease of native coronary artery without angina pectoris: Secondary | ICD-10-CM | POA: Diagnosis not present

## 2014-07-09 DIAGNOSIS — E119 Type 2 diabetes mellitus without complications: Secondary | ICD-10-CM | POA: Diagnosis not present

## 2014-07-10 DIAGNOSIS — I2510 Atherosclerotic heart disease of native coronary artery without angina pectoris: Secondary | ICD-10-CM | POA: Diagnosis not present

## 2014-07-10 DIAGNOSIS — F209 Schizophrenia, unspecified: Secondary | ICD-10-CM | POA: Diagnosis not present

## 2014-07-10 DIAGNOSIS — B182 Chronic viral hepatitis C: Secondary | ICD-10-CM | POA: Diagnosis not present

## 2014-07-10 DIAGNOSIS — E119 Type 2 diabetes mellitus without complications: Secondary | ICD-10-CM | POA: Diagnosis not present

## 2014-07-10 DIAGNOSIS — I10 Essential (primary) hypertension: Secondary | ICD-10-CM | POA: Diagnosis not present

## 2014-07-10 DIAGNOSIS — L02612 Cutaneous abscess of left foot: Secondary | ICD-10-CM | POA: Diagnosis not present

## 2014-07-13 DIAGNOSIS — I2510 Atherosclerotic heart disease of native coronary artery without angina pectoris: Secondary | ICD-10-CM | POA: Diagnosis not present

## 2014-07-13 DIAGNOSIS — I10 Essential (primary) hypertension: Secondary | ICD-10-CM | POA: Diagnosis not present

## 2014-07-13 DIAGNOSIS — B182 Chronic viral hepatitis C: Secondary | ICD-10-CM | POA: Diagnosis not present

## 2014-07-13 DIAGNOSIS — L02612 Cutaneous abscess of left foot: Secondary | ICD-10-CM | POA: Diagnosis not present

## 2014-07-13 DIAGNOSIS — F209 Schizophrenia, unspecified: Secondary | ICD-10-CM | POA: Diagnosis not present

## 2014-07-13 DIAGNOSIS — E119 Type 2 diabetes mellitus without complications: Secondary | ICD-10-CM | POA: Diagnosis not present

## 2014-07-16 DIAGNOSIS — B182 Chronic viral hepatitis C: Secondary | ICD-10-CM | POA: Diagnosis not present

## 2014-07-16 DIAGNOSIS — L02612 Cutaneous abscess of left foot: Secondary | ICD-10-CM | POA: Diagnosis not present

## 2014-07-16 DIAGNOSIS — I10 Essential (primary) hypertension: Secondary | ICD-10-CM | POA: Diagnosis not present

## 2014-07-16 DIAGNOSIS — E119 Type 2 diabetes mellitus without complications: Secondary | ICD-10-CM | POA: Diagnosis not present

## 2014-07-16 DIAGNOSIS — I2510 Atherosclerotic heart disease of native coronary artery without angina pectoris: Secondary | ICD-10-CM | POA: Diagnosis not present

## 2014-07-16 DIAGNOSIS — F209 Schizophrenia, unspecified: Secondary | ICD-10-CM | POA: Diagnosis not present

## 2014-07-17 DIAGNOSIS — I10 Essential (primary) hypertension: Secondary | ICD-10-CM | POA: Diagnosis not present

## 2014-07-17 DIAGNOSIS — F209 Schizophrenia, unspecified: Secondary | ICD-10-CM | POA: Diagnosis not present

## 2014-07-17 DIAGNOSIS — I2510 Atherosclerotic heart disease of native coronary artery without angina pectoris: Secondary | ICD-10-CM | POA: Diagnosis not present

## 2014-07-17 DIAGNOSIS — L02612 Cutaneous abscess of left foot: Secondary | ICD-10-CM | POA: Diagnosis not present

## 2014-07-17 DIAGNOSIS — E119 Type 2 diabetes mellitus without complications: Secondary | ICD-10-CM | POA: Diagnosis not present

## 2014-07-17 DIAGNOSIS — B182 Chronic viral hepatitis C: Secondary | ICD-10-CM | POA: Diagnosis not present

## 2014-07-21 DIAGNOSIS — F209 Schizophrenia, unspecified: Secondary | ICD-10-CM | POA: Diagnosis not present

## 2014-07-21 DIAGNOSIS — L02612 Cutaneous abscess of left foot: Secondary | ICD-10-CM | POA: Diagnosis not present

## 2014-07-21 DIAGNOSIS — I2510 Atherosclerotic heart disease of native coronary artery without angina pectoris: Secondary | ICD-10-CM | POA: Diagnosis not present

## 2014-07-21 DIAGNOSIS — E119 Type 2 diabetes mellitus without complications: Secondary | ICD-10-CM | POA: Diagnosis not present

## 2014-07-21 DIAGNOSIS — I10 Essential (primary) hypertension: Secondary | ICD-10-CM | POA: Diagnosis not present

## 2014-07-21 DIAGNOSIS — B182 Chronic viral hepatitis C: Secondary | ICD-10-CM | POA: Diagnosis not present

## 2014-07-22 DIAGNOSIS — L02612 Cutaneous abscess of left foot: Secondary | ICD-10-CM | POA: Diagnosis not present

## 2014-07-22 DIAGNOSIS — E119 Type 2 diabetes mellitus without complications: Secondary | ICD-10-CM | POA: Diagnosis not present

## 2014-07-22 DIAGNOSIS — F209 Schizophrenia, unspecified: Secondary | ICD-10-CM | POA: Diagnosis not present

## 2014-07-22 DIAGNOSIS — I10 Essential (primary) hypertension: Secondary | ICD-10-CM | POA: Diagnosis not present

## 2014-07-22 DIAGNOSIS — B182 Chronic viral hepatitis C: Secondary | ICD-10-CM | POA: Diagnosis not present

## 2014-07-22 DIAGNOSIS — I2510 Atherosclerotic heart disease of native coronary artery without angina pectoris: Secondary | ICD-10-CM | POA: Diagnosis not present

## 2014-07-23 DIAGNOSIS — L02612 Cutaneous abscess of left foot: Secondary | ICD-10-CM | POA: Diagnosis not present

## 2014-07-23 DIAGNOSIS — I2510 Atherosclerotic heart disease of native coronary artery without angina pectoris: Secondary | ICD-10-CM | POA: Diagnosis not present

## 2014-07-23 DIAGNOSIS — F209 Schizophrenia, unspecified: Secondary | ICD-10-CM | POA: Diagnosis not present

## 2014-07-23 DIAGNOSIS — I10 Essential (primary) hypertension: Secondary | ICD-10-CM | POA: Diagnosis not present

## 2014-07-23 DIAGNOSIS — E119 Type 2 diabetes mellitus without complications: Secondary | ICD-10-CM | POA: Diagnosis not present

## 2014-07-23 DIAGNOSIS — B182 Chronic viral hepatitis C: Secondary | ICD-10-CM | POA: Diagnosis not present

## 2014-07-28 DIAGNOSIS — I2510 Atherosclerotic heart disease of native coronary artery without angina pectoris: Secondary | ICD-10-CM | POA: Diagnosis not present

## 2014-07-28 DIAGNOSIS — F209 Schizophrenia, unspecified: Secondary | ICD-10-CM | POA: Diagnosis not present

## 2014-07-28 DIAGNOSIS — E119 Type 2 diabetes mellitus without complications: Secondary | ICD-10-CM | POA: Diagnosis not present

## 2014-07-28 DIAGNOSIS — I10 Essential (primary) hypertension: Secondary | ICD-10-CM | POA: Diagnosis not present

## 2014-07-28 DIAGNOSIS — L02612 Cutaneous abscess of left foot: Secondary | ICD-10-CM | POA: Diagnosis not present

## 2014-07-28 DIAGNOSIS — B182 Chronic viral hepatitis C: Secondary | ICD-10-CM | POA: Diagnosis not present

## 2014-07-29 DIAGNOSIS — G894 Chronic pain syndrome: Secondary | ICD-10-CM | POA: Diagnosis not present

## 2014-07-29 DIAGNOSIS — L02612 Cutaneous abscess of left foot: Secondary | ICD-10-CM | POA: Diagnosis not present

## 2014-07-29 DIAGNOSIS — B182 Chronic viral hepatitis C: Secondary | ICD-10-CM | POA: Diagnosis not present

## 2014-07-29 DIAGNOSIS — M79609 Pain in unspecified limb: Secondary | ICD-10-CM | POA: Diagnosis not present

## 2014-07-29 DIAGNOSIS — F209 Schizophrenia, unspecified: Secondary | ICD-10-CM | POA: Diagnosis not present

## 2014-07-29 DIAGNOSIS — I2510 Atherosclerotic heart disease of native coronary artery without angina pectoris: Secondary | ICD-10-CM | POA: Diagnosis not present

## 2014-07-29 DIAGNOSIS — E119 Type 2 diabetes mellitus without complications: Secondary | ICD-10-CM | POA: Diagnosis not present

## 2014-07-29 DIAGNOSIS — I10 Essential (primary) hypertension: Secondary | ICD-10-CM | POA: Diagnosis not present

## 2014-07-30 DIAGNOSIS — I890 Lymphedema, not elsewhere classified: Secondary | ICD-10-CM | POA: Diagnosis not present

## 2014-07-30 DIAGNOSIS — M79609 Pain in unspecified limb: Secondary | ICD-10-CM | POA: Diagnosis not present

## 2014-07-30 DIAGNOSIS — E119 Type 2 diabetes mellitus without complications: Secondary | ICD-10-CM | POA: Diagnosis not present

## 2014-07-31 DIAGNOSIS — I10 Essential (primary) hypertension: Secondary | ICD-10-CM | POA: Diagnosis not present

## 2014-07-31 DIAGNOSIS — E119 Type 2 diabetes mellitus without complications: Secondary | ICD-10-CM | POA: Diagnosis not present

## 2014-07-31 DIAGNOSIS — L02612 Cutaneous abscess of left foot: Secondary | ICD-10-CM | POA: Diagnosis not present

## 2014-07-31 DIAGNOSIS — F209 Schizophrenia, unspecified: Secondary | ICD-10-CM | POA: Diagnosis not present

## 2014-07-31 DIAGNOSIS — B182 Chronic viral hepatitis C: Secondary | ICD-10-CM | POA: Diagnosis not present

## 2014-07-31 DIAGNOSIS — I2510 Atherosclerotic heart disease of native coronary artery without angina pectoris: Secondary | ICD-10-CM | POA: Diagnosis not present

## 2014-08-04 DIAGNOSIS — S90456A Superficial foreign body, unspecified lesser toe(s), initial encounter: Secondary | ICD-10-CM | POA: Diagnosis not present

## 2014-08-04 DIAGNOSIS — E780 Pure hypercholesterolemia: Secondary | ICD-10-CM | POA: Diagnosis present

## 2014-08-04 DIAGNOSIS — M86172 Other acute osteomyelitis, left ankle and foot: Secondary | ICD-10-CM | POA: Diagnosis not present

## 2014-08-04 DIAGNOSIS — Z9049 Acquired absence of other specified parts of digestive tract: Secondary | ICD-10-CM | POA: Diagnosis present

## 2014-08-04 DIAGNOSIS — L0390 Cellulitis, unspecified: Secondary | ICD-10-CM | POA: Diagnosis not present

## 2014-08-04 DIAGNOSIS — M79672 Pain in left foot: Secondary | ICD-10-CM | POA: Diagnosis not present

## 2014-08-04 DIAGNOSIS — L02612 Cutaneous abscess of left foot: Secondary | ICD-10-CM | POA: Diagnosis not present

## 2014-08-04 DIAGNOSIS — M1990 Unspecified osteoarthritis, unspecified site: Secondary | ICD-10-CM | POA: Diagnosis present

## 2014-08-04 DIAGNOSIS — I10 Essential (primary) hypertension: Secondary | ICD-10-CM | POA: Diagnosis present

## 2014-08-04 DIAGNOSIS — Z792 Long term (current) use of antibiotics: Secondary | ICD-10-CM | POA: Diagnosis not present

## 2014-08-04 DIAGNOSIS — F419 Anxiety disorder, unspecified: Secondary | ICD-10-CM | POA: Diagnosis present

## 2014-08-04 DIAGNOSIS — Z87442 Personal history of urinary calculi: Secondary | ICD-10-CM | POA: Diagnosis not present

## 2014-08-04 DIAGNOSIS — Z6836 Body mass index (BMI) 36.0-36.9, adult: Secondary | ICD-10-CM | POA: Diagnosis not present

## 2014-08-04 DIAGNOSIS — S90852A Superficial foreign body, left foot, initial encounter: Secondary | ICD-10-CM | POA: Diagnosis not present

## 2014-08-04 DIAGNOSIS — Z452 Encounter for adjustment and management of vascular access device: Secondary | ICD-10-CM | POA: Diagnosis not present

## 2014-08-04 DIAGNOSIS — I2510 Atherosclerotic heart disease of native coronary artery without angina pectoris: Secondary | ICD-10-CM | POA: Diagnosis present

## 2014-08-04 DIAGNOSIS — F329 Major depressive disorder, single episode, unspecified: Secondary | ICD-10-CM | POA: Diagnosis present

## 2014-08-04 DIAGNOSIS — I999 Unspecified disorder of circulatory system: Secondary | ICD-10-CM | POA: Diagnosis not present

## 2014-08-04 DIAGNOSIS — E119 Type 2 diabetes mellitus without complications: Secondary | ICD-10-CM | POA: Diagnosis not present

## 2014-08-04 DIAGNOSIS — J45909 Unspecified asthma, uncomplicated: Secondary | ICD-10-CM | POA: Diagnosis present

## 2014-08-04 DIAGNOSIS — L03116 Cellulitis of left lower limb: Secondary | ICD-10-CM | POA: Diagnosis not present

## 2014-08-04 DIAGNOSIS — K219 Gastro-esophageal reflux disease without esophagitis: Secondary | ICD-10-CM | POA: Diagnosis present

## 2014-08-04 DIAGNOSIS — Z720 Tobacco use: Secondary | ICD-10-CM | POA: Diagnosis not present

## 2014-08-04 DIAGNOSIS — B182 Chronic viral hepatitis C: Secondary | ICD-10-CM | POA: Diagnosis present

## 2014-08-04 DIAGNOSIS — F209 Schizophrenia, unspecified: Secondary | ICD-10-CM | POA: Diagnosis present

## 2014-08-04 DIAGNOSIS — B957 Other staphylococcus as the cause of diseases classified elsewhere: Secondary | ICD-10-CM | POA: Diagnosis present

## 2014-08-04 DIAGNOSIS — L03115 Cellulitis of right lower limb: Secondary | ICD-10-CM | POA: Diagnosis not present

## 2014-08-04 DIAGNOSIS — J449 Chronic obstructive pulmonary disease, unspecified: Secondary | ICD-10-CM | POA: Diagnosis present

## 2014-08-04 DIAGNOSIS — M869 Osteomyelitis, unspecified: Secondary | ICD-10-CM | POA: Diagnosis not present

## 2014-08-04 DIAGNOSIS — G894 Chronic pain syndrome: Secondary | ICD-10-CM | POA: Diagnosis not present

## 2014-08-04 DIAGNOSIS — B1920 Unspecified viral hepatitis C without hepatic coma: Secondary | ICD-10-CM | POA: Diagnosis not present

## 2014-08-04 DIAGNOSIS — Z886 Allergy status to analgesic agent status: Secondary | ICD-10-CM | POA: Diagnosis not present

## 2014-08-04 DIAGNOSIS — S91302A Unspecified open wound, left foot, initial encounter: Secondary | ICD-10-CM | POA: Diagnosis not present

## 2014-08-10 DIAGNOSIS — R05 Cough: Secondary | ICD-10-CM | POA: Diagnosis not present

## 2014-08-12 DIAGNOSIS — F419 Anxiety disorder, unspecified: Secondary | ICD-10-CM | POA: Diagnosis not present

## 2014-08-12 DIAGNOSIS — F331 Major depressive disorder, recurrent, moderate: Secondary | ICD-10-CM | POA: Diagnosis not present

## 2014-08-16 DIAGNOSIS — G894 Chronic pain syndrome: Secondary | ICD-10-CM | POA: Diagnosis not present

## 2014-08-16 DIAGNOSIS — E119 Type 2 diabetes mellitus without complications: Secondary | ICD-10-CM | POA: Diagnosis not present

## 2014-08-16 DIAGNOSIS — F209 Schizophrenia, unspecified: Secondary | ICD-10-CM | POA: Diagnosis not present

## 2014-08-16 DIAGNOSIS — M86172 Other acute osteomyelitis, left ankle and foot: Secondary | ICD-10-CM | POA: Diagnosis not present

## 2014-08-17 DIAGNOSIS — M86172 Other acute osteomyelitis, left ankle and foot: Secondary | ICD-10-CM | POA: Diagnosis not present

## 2014-08-17 DIAGNOSIS — F331 Major depressive disorder, recurrent, moderate: Secondary | ICD-10-CM | POA: Diagnosis not present

## 2014-08-17 DIAGNOSIS — F209 Schizophrenia, unspecified: Secondary | ICD-10-CM | POA: Diagnosis not present

## 2014-08-17 DIAGNOSIS — G894 Chronic pain syndrome: Secondary | ICD-10-CM | POA: Diagnosis not present

## 2014-08-17 DIAGNOSIS — F419 Anxiety disorder, unspecified: Secondary | ICD-10-CM | POA: Diagnosis not present

## 2014-08-17 DIAGNOSIS — E119 Type 2 diabetes mellitus without complications: Secondary | ICD-10-CM | POA: Diagnosis not present

## 2014-08-18 DIAGNOSIS — M86172 Other acute osteomyelitis, left ankle and foot: Secondary | ICD-10-CM | POA: Diagnosis not present

## 2014-08-18 DIAGNOSIS — E119 Type 2 diabetes mellitus without complications: Secondary | ICD-10-CM | POA: Diagnosis not present

## 2014-08-18 DIAGNOSIS — F209 Schizophrenia, unspecified: Secondary | ICD-10-CM | POA: Diagnosis not present

## 2014-08-18 DIAGNOSIS — G894 Chronic pain syndrome: Secondary | ICD-10-CM | POA: Diagnosis not present

## 2014-08-19 DIAGNOSIS — E119 Type 2 diabetes mellitus without complications: Secondary | ICD-10-CM | POA: Diagnosis not present

## 2014-08-19 DIAGNOSIS — F209 Schizophrenia, unspecified: Secondary | ICD-10-CM | POA: Diagnosis not present

## 2014-08-19 DIAGNOSIS — G894 Chronic pain syndrome: Secondary | ICD-10-CM | POA: Diagnosis not present

## 2014-08-19 DIAGNOSIS — M86172 Other acute osteomyelitis, left ankle and foot: Secondary | ICD-10-CM | POA: Diagnosis not present

## 2014-08-20 DIAGNOSIS — Z4682 Encounter for fitting and adjustment of non-vascular catheter: Secondary | ICD-10-CM | POA: Diagnosis not present

## 2014-08-20 DIAGNOSIS — M86172 Other acute osteomyelitis, left ankle and foot: Secondary | ICD-10-CM | POA: Diagnosis not present

## 2014-08-20 DIAGNOSIS — Z452 Encounter for adjustment and management of vascular access device: Secondary | ICD-10-CM | POA: Diagnosis not present

## 2014-08-20 DIAGNOSIS — G894 Chronic pain syndrome: Secondary | ICD-10-CM | POA: Diagnosis not present

## 2014-08-20 DIAGNOSIS — M868X7 Other osteomyelitis, ankle and foot: Secondary | ICD-10-CM | POA: Diagnosis not present

## 2014-08-20 DIAGNOSIS — I10 Essential (primary) hypertension: Secondary | ICD-10-CM | POA: Diagnosis not present

## 2014-08-20 DIAGNOSIS — E119 Type 2 diabetes mellitus without complications: Secondary | ICD-10-CM | POA: Diagnosis not present

## 2014-08-21 DIAGNOSIS — M86172 Other acute osteomyelitis, left ankle and foot: Secondary | ICD-10-CM | POA: Diagnosis not present

## 2014-08-21 DIAGNOSIS — G894 Chronic pain syndrome: Secondary | ICD-10-CM | POA: Diagnosis not present

## 2014-08-21 DIAGNOSIS — E119 Type 2 diabetes mellitus without complications: Secondary | ICD-10-CM | POA: Diagnosis not present

## 2014-08-21 DIAGNOSIS — I10 Essential (primary) hypertension: Secondary | ICD-10-CM | POA: Diagnosis not present

## 2014-08-22 DIAGNOSIS — E119 Type 2 diabetes mellitus without complications: Secondary | ICD-10-CM | POA: Diagnosis not present

## 2014-08-22 DIAGNOSIS — G894 Chronic pain syndrome: Secondary | ICD-10-CM | POA: Diagnosis not present

## 2014-08-22 DIAGNOSIS — M86172 Other acute osteomyelitis, left ankle and foot: Secondary | ICD-10-CM | POA: Diagnosis not present

## 2014-08-22 DIAGNOSIS — I10 Essential (primary) hypertension: Secondary | ICD-10-CM | POA: Diagnosis not present

## 2014-08-23 DIAGNOSIS — G894 Chronic pain syndrome: Secondary | ICD-10-CM | POA: Diagnosis not present

## 2014-08-23 DIAGNOSIS — M86172 Other acute osteomyelitis, left ankle and foot: Secondary | ICD-10-CM | POA: Diagnosis not present

## 2014-08-23 DIAGNOSIS — I10 Essential (primary) hypertension: Secondary | ICD-10-CM | POA: Diagnosis not present

## 2014-08-23 DIAGNOSIS — E119 Type 2 diabetes mellitus without complications: Secondary | ICD-10-CM | POA: Diagnosis not present

## 2014-08-24 DIAGNOSIS — G894 Chronic pain syndrome: Secondary | ICD-10-CM | POA: Diagnosis not present

## 2014-08-24 DIAGNOSIS — F209 Schizophrenia, unspecified: Secondary | ICD-10-CM | POA: Diagnosis not present

## 2014-08-24 DIAGNOSIS — F419 Anxiety disorder, unspecified: Secondary | ICD-10-CM | POA: Diagnosis not present

## 2014-08-24 DIAGNOSIS — E119 Type 2 diabetes mellitus without complications: Secondary | ICD-10-CM | POA: Diagnosis not present

## 2014-08-24 DIAGNOSIS — F331 Major depressive disorder, recurrent, moderate: Secondary | ICD-10-CM | POA: Diagnosis not present

## 2014-08-24 DIAGNOSIS — M86172 Other acute osteomyelitis, left ankle and foot: Secondary | ICD-10-CM | POA: Diagnosis not present

## 2014-08-25 DIAGNOSIS — F209 Schizophrenia, unspecified: Secondary | ICD-10-CM | POA: Diagnosis not present

## 2014-08-25 DIAGNOSIS — M86172 Other acute osteomyelitis, left ankle and foot: Secondary | ICD-10-CM | POA: Diagnosis not present

## 2014-08-25 DIAGNOSIS — E119 Type 2 diabetes mellitus without complications: Secondary | ICD-10-CM | POA: Diagnosis not present

## 2014-08-25 DIAGNOSIS — G894 Chronic pain syndrome: Secondary | ICD-10-CM | POA: Diagnosis not present

## 2014-08-26 DIAGNOSIS — G894 Chronic pain syndrome: Secondary | ICD-10-CM | POA: Diagnosis not present

## 2014-08-26 DIAGNOSIS — F209 Schizophrenia, unspecified: Secondary | ICD-10-CM | POA: Diagnosis not present

## 2014-08-26 DIAGNOSIS — E119 Type 2 diabetes mellitus without complications: Secondary | ICD-10-CM | POA: Diagnosis not present

## 2014-08-26 DIAGNOSIS — M86172 Other acute osteomyelitis, left ankle and foot: Secondary | ICD-10-CM | POA: Diagnosis not present

## 2014-08-27 DIAGNOSIS — G894 Chronic pain syndrome: Secondary | ICD-10-CM | POA: Diagnosis not present

## 2014-08-27 DIAGNOSIS — M86172 Other acute osteomyelitis, left ankle and foot: Secondary | ICD-10-CM | POA: Diagnosis not present

## 2014-08-27 DIAGNOSIS — F209 Schizophrenia, unspecified: Secondary | ICD-10-CM | POA: Diagnosis not present

## 2014-08-27 DIAGNOSIS — E119 Type 2 diabetes mellitus without complications: Secondary | ICD-10-CM | POA: Diagnosis not present

## 2014-08-28 DIAGNOSIS — F209 Schizophrenia, unspecified: Secondary | ICD-10-CM | POA: Diagnosis not present

## 2014-08-28 DIAGNOSIS — M86172 Other acute osteomyelitis, left ankle and foot: Secondary | ICD-10-CM | POA: Diagnosis not present

## 2014-08-28 DIAGNOSIS — G894 Chronic pain syndrome: Secondary | ICD-10-CM | POA: Diagnosis not present

## 2014-08-28 DIAGNOSIS — M79672 Pain in left foot: Secondary | ICD-10-CM | POA: Diagnosis not present

## 2014-08-28 DIAGNOSIS — E119 Type 2 diabetes mellitus without complications: Secondary | ICD-10-CM | POA: Diagnosis not present

## 2014-09-03 DIAGNOSIS — M86272 Subacute osteomyelitis, left ankle and foot: Secondary | ICD-10-CM | POA: Diagnosis not present

## 2014-09-03 DIAGNOSIS — G894 Chronic pain syndrome: Secondary | ICD-10-CM | POA: Diagnosis not present

## 2014-09-10 DIAGNOSIS — E1165 Type 2 diabetes mellitus with hyperglycemia: Secondary | ICD-10-CM | POA: Diagnosis not present

## 2014-09-15 DIAGNOSIS — G4700 Insomnia, unspecified: Secondary | ICD-10-CM | POA: Diagnosis not present

## 2014-09-15 DIAGNOSIS — R5382 Chronic fatigue, unspecified: Secondary | ICD-10-CM | POA: Diagnosis not present

## 2014-09-15 DIAGNOSIS — I10 Essential (primary) hypertension: Secondary | ICD-10-CM | POA: Diagnosis not present

## 2014-09-15 DIAGNOSIS — G894 Chronic pain syndrome: Secondary | ICD-10-CM | POA: Diagnosis not present

## 2014-09-16 DIAGNOSIS — B9562 Methicillin resistant Staphylococcus aureus infection as the cause of diseases classified elsewhere: Secondary | ICD-10-CM | POA: Diagnosis not present

## 2014-09-16 DIAGNOSIS — G8929 Other chronic pain: Secondary | ICD-10-CM | POA: Diagnosis not present

## 2014-09-16 DIAGNOSIS — F209 Schizophrenia, unspecified: Secondary | ICD-10-CM | POA: Diagnosis not present

## 2014-09-16 DIAGNOSIS — F329 Major depressive disorder, single episode, unspecified: Secondary | ICD-10-CM | POA: Diagnosis not present

## 2014-09-16 DIAGNOSIS — F17210 Nicotine dependence, cigarettes, uncomplicated: Secondary | ICD-10-CM | POA: Diagnosis not present

## 2014-09-16 DIAGNOSIS — M86172 Other acute osteomyelitis, left ankle and foot: Secondary | ICD-10-CM | POA: Diagnosis not present

## 2014-09-16 DIAGNOSIS — I10 Essential (primary) hypertension: Secondary | ICD-10-CM | POA: Diagnosis not present

## 2014-09-16 DIAGNOSIS — E119 Type 2 diabetes mellitus without complications: Secondary | ICD-10-CM | POA: Diagnosis not present

## 2014-09-16 DIAGNOSIS — I2510 Atherosclerotic heart disease of native coronary artery without angina pectoris: Secondary | ICD-10-CM | POA: Diagnosis not present

## 2014-09-21 DIAGNOSIS — F209 Schizophrenia, unspecified: Secondary | ICD-10-CM | POA: Diagnosis not present

## 2014-09-21 DIAGNOSIS — I10 Essential (primary) hypertension: Secondary | ICD-10-CM | POA: Diagnosis not present

## 2014-09-21 DIAGNOSIS — E119 Type 2 diabetes mellitus without complications: Secondary | ICD-10-CM | POA: Diagnosis not present

## 2014-09-21 DIAGNOSIS — M86172 Other acute osteomyelitis, left ankle and foot: Secondary | ICD-10-CM | POA: Diagnosis not present

## 2014-09-21 DIAGNOSIS — G8929 Other chronic pain: Secondary | ICD-10-CM | POA: Diagnosis not present

## 2014-09-21 DIAGNOSIS — B9562 Methicillin resistant Staphylococcus aureus infection as the cause of diseases classified elsewhere: Secondary | ICD-10-CM | POA: Diagnosis not present

## 2014-09-22 DIAGNOSIS — M86172 Other acute osteomyelitis, left ankle and foot: Secondary | ICD-10-CM | POA: Diagnosis not present

## 2014-09-22 DIAGNOSIS — F209 Schizophrenia, unspecified: Secondary | ICD-10-CM | POA: Diagnosis not present

## 2014-09-22 DIAGNOSIS — I10 Essential (primary) hypertension: Secondary | ICD-10-CM | POA: Diagnosis not present

## 2014-09-22 DIAGNOSIS — G8929 Other chronic pain: Secondary | ICD-10-CM | POA: Diagnosis not present

## 2014-09-22 DIAGNOSIS — E119 Type 2 diabetes mellitus without complications: Secondary | ICD-10-CM | POA: Diagnosis not present

## 2014-09-22 DIAGNOSIS — B9562 Methicillin resistant Staphylococcus aureus infection as the cause of diseases classified elsewhere: Secondary | ICD-10-CM | POA: Diagnosis not present

## 2014-09-24 DIAGNOSIS — G8929 Other chronic pain: Secondary | ICD-10-CM | POA: Diagnosis not present

## 2014-09-24 DIAGNOSIS — F209 Schizophrenia, unspecified: Secondary | ICD-10-CM | POA: Diagnosis not present

## 2014-09-24 DIAGNOSIS — E119 Type 2 diabetes mellitus without complications: Secondary | ICD-10-CM | POA: Diagnosis not present

## 2014-09-24 DIAGNOSIS — I10 Essential (primary) hypertension: Secondary | ICD-10-CM | POA: Diagnosis not present

## 2014-09-24 DIAGNOSIS — M86172 Other acute osteomyelitis, left ankle and foot: Secondary | ICD-10-CM | POA: Diagnosis not present

## 2014-09-24 DIAGNOSIS — B9562 Methicillin resistant Staphylococcus aureus infection as the cause of diseases classified elsewhere: Secondary | ICD-10-CM | POA: Diagnosis not present

## 2014-09-29 DIAGNOSIS — E119 Type 2 diabetes mellitus without complications: Secondary | ICD-10-CM | POA: Diagnosis not present

## 2014-09-29 DIAGNOSIS — F209 Schizophrenia, unspecified: Secondary | ICD-10-CM | POA: Diagnosis not present

## 2014-09-29 DIAGNOSIS — G8929 Other chronic pain: Secondary | ICD-10-CM | POA: Diagnosis not present

## 2014-09-29 DIAGNOSIS — M86172 Other acute osteomyelitis, left ankle and foot: Secondary | ICD-10-CM | POA: Diagnosis not present

## 2014-09-29 DIAGNOSIS — I10 Essential (primary) hypertension: Secondary | ICD-10-CM | POA: Diagnosis not present

## 2014-09-29 DIAGNOSIS — B9562 Methicillin resistant Staphylococcus aureus infection as the cause of diseases classified elsewhere: Secondary | ICD-10-CM | POA: Diagnosis not present

## 2014-09-30 DIAGNOSIS — G8929 Other chronic pain: Secondary | ICD-10-CM | POA: Diagnosis not present

## 2014-09-30 DIAGNOSIS — B9562 Methicillin resistant Staphylococcus aureus infection as the cause of diseases classified elsewhere: Secondary | ICD-10-CM | POA: Diagnosis not present

## 2014-09-30 DIAGNOSIS — M86172 Other acute osteomyelitis, left ankle and foot: Secondary | ICD-10-CM | POA: Diagnosis not present

## 2014-09-30 DIAGNOSIS — I10 Essential (primary) hypertension: Secondary | ICD-10-CM | POA: Diagnosis not present

## 2014-09-30 DIAGNOSIS — E119 Type 2 diabetes mellitus without complications: Secondary | ICD-10-CM | POA: Diagnosis not present

## 2014-09-30 DIAGNOSIS — F209 Schizophrenia, unspecified: Secondary | ICD-10-CM | POA: Diagnosis not present

## 2014-10-05 DIAGNOSIS — E119 Type 2 diabetes mellitus without complications: Secondary | ICD-10-CM | POA: Diagnosis not present

## 2014-10-05 DIAGNOSIS — B9562 Methicillin resistant Staphylococcus aureus infection as the cause of diseases classified elsewhere: Secondary | ICD-10-CM | POA: Diagnosis not present

## 2014-10-05 DIAGNOSIS — I10 Essential (primary) hypertension: Secondary | ICD-10-CM | POA: Diagnosis not present

## 2014-10-05 DIAGNOSIS — F209 Schizophrenia, unspecified: Secondary | ICD-10-CM | POA: Diagnosis not present

## 2014-10-05 DIAGNOSIS — M86172 Other acute osteomyelitis, left ankle and foot: Secondary | ICD-10-CM | POA: Diagnosis not present

## 2014-10-05 DIAGNOSIS — G8929 Other chronic pain: Secondary | ICD-10-CM | POA: Diagnosis not present

## 2014-10-08 DIAGNOSIS — F209 Schizophrenia, unspecified: Secondary | ICD-10-CM | POA: Diagnosis not present

## 2014-10-08 DIAGNOSIS — M86172 Other acute osteomyelitis, left ankle and foot: Secondary | ICD-10-CM | POA: Diagnosis not present

## 2014-10-08 DIAGNOSIS — B9562 Methicillin resistant Staphylococcus aureus infection as the cause of diseases classified elsewhere: Secondary | ICD-10-CM | POA: Diagnosis not present

## 2014-10-08 DIAGNOSIS — I10 Essential (primary) hypertension: Secondary | ICD-10-CM | POA: Diagnosis not present

## 2014-10-08 DIAGNOSIS — E119 Type 2 diabetes mellitus without complications: Secondary | ICD-10-CM | POA: Diagnosis not present

## 2014-10-08 DIAGNOSIS — G8929 Other chronic pain: Secondary | ICD-10-CM | POA: Diagnosis not present

## 2014-10-09 DIAGNOSIS — M86172 Other acute osteomyelitis, left ankle and foot: Secondary | ICD-10-CM | POA: Diagnosis not present

## 2014-10-09 DIAGNOSIS — I10 Essential (primary) hypertension: Secondary | ICD-10-CM | POA: Diagnosis not present

## 2014-10-09 DIAGNOSIS — F209 Schizophrenia, unspecified: Secondary | ICD-10-CM | POA: Diagnosis not present

## 2014-10-09 DIAGNOSIS — G8929 Other chronic pain: Secondary | ICD-10-CM | POA: Diagnosis not present

## 2014-10-09 DIAGNOSIS — B9562 Methicillin resistant Staphylococcus aureus infection as the cause of diseases classified elsewhere: Secondary | ICD-10-CM | POA: Diagnosis not present

## 2014-10-09 DIAGNOSIS — E119 Type 2 diabetes mellitus without complications: Secondary | ICD-10-CM | POA: Diagnosis not present

## 2014-10-13 DIAGNOSIS — I10 Essential (primary) hypertension: Secondary | ICD-10-CM | POA: Diagnosis not present

## 2014-10-13 DIAGNOSIS — F209 Schizophrenia, unspecified: Secondary | ICD-10-CM | POA: Diagnosis not present

## 2014-10-13 DIAGNOSIS — G8929 Other chronic pain: Secondary | ICD-10-CM | POA: Diagnosis not present

## 2014-10-13 DIAGNOSIS — M86172 Other acute osteomyelitis, left ankle and foot: Secondary | ICD-10-CM | POA: Diagnosis not present

## 2014-10-13 DIAGNOSIS — B9562 Methicillin resistant Staphylococcus aureus infection as the cause of diseases classified elsewhere: Secondary | ICD-10-CM | POA: Diagnosis not present

## 2014-10-13 DIAGNOSIS — E119 Type 2 diabetes mellitus without complications: Secondary | ICD-10-CM | POA: Diagnosis not present

## 2014-10-20 DIAGNOSIS — F209 Schizophrenia, unspecified: Secondary | ICD-10-CM | POA: Diagnosis not present

## 2014-10-20 DIAGNOSIS — I10 Essential (primary) hypertension: Secondary | ICD-10-CM | POA: Diagnosis not present

## 2014-10-20 DIAGNOSIS — B9562 Methicillin resistant Staphylococcus aureus infection as the cause of diseases classified elsewhere: Secondary | ICD-10-CM | POA: Diagnosis not present

## 2014-10-20 DIAGNOSIS — G8929 Other chronic pain: Secondary | ICD-10-CM | POA: Diagnosis not present

## 2014-10-20 DIAGNOSIS — M86172 Other acute osteomyelitis, left ankle and foot: Secondary | ICD-10-CM | POA: Diagnosis not present

## 2014-10-20 DIAGNOSIS — E119 Type 2 diabetes mellitus without complications: Secondary | ICD-10-CM | POA: Diagnosis not present

## 2014-10-27 DIAGNOSIS — I10 Essential (primary) hypertension: Secondary | ICD-10-CM | POA: Diagnosis not present

## 2014-10-27 DIAGNOSIS — B9562 Methicillin resistant Staphylococcus aureus infection as the cause of diseases classified elsewhere: Secondary | ICD-10-CM | POA: Diagnosis not present

## 2014-10-27 DIAGNOSIS — F209 Schizophrenia, unspecified: Secondary | ICD-10-CM | POA: Diagnosis not present

## 2014-10-27 DIAGNOSIS — E119 Type 2 diabetes mellitus without complications: Secondary | ICD-10-CM | POA: Diagnosis not present

## 2014-10-27 DIAGNOSIS — M86172 Other acute osteomyelitis, left ankle and foot: Secondary | ICD-10-CM | POA: Diagnosis not present

## 2014-10-27 DIAGNOSIS — G8929 Other chronic pain: Secondary | ICD-10-CM | POA: Diagnosis not present

## 2014-12-29 DIAGNOSIS — J209 Acute bronchitis, unspecified: Secondary | ICD-10-CM | POA: Diagnosis not present

## 2014-12-29 DIAGNOSIS — J449 Chronic obstructive pulmonary disease, unspecified: Secondary | ICD-10-CM | POA: Diagnosis not present

## 2014-12-29 DIAGNOSIS — Z5181 Encounter for therapeutic drug level monitoring: Secondary | ICD-10-CM | POA: Diagnosis not present

## 2014-12-29 DIAGNOSIS — E119 Type 2 diabetes mellitus without complications: Secondary | ICD-10-CM | POA: Diagnosis not present

## 2014-12-29 DIAGNOSIS — F419 Anxiety disorder, unspecified: Secondary | ICD-10-CM | POA: Diagnosis not present

## 2014-12-29 DIAGNOSIS — Z79891 Long term (current) use of opiate analgesic: Secondary | ICD-10-CM | POA: Diagnosis not present

## 2014-12-29 DIAGNOSIS — G894 Chronic pain syndrome: Secondary | ICD-10-CM | POA: Diagnosis not present

## 2014-12-30 DIAGNOSIS — Z79891 Long term (current) use of opiate analgesic: Secondary | ICD-10-CM | POA: Diagnosis not present

## 2015-01-04 DIAGNOSIS — G894 Chronic pain syndrome: Secondary | ICD-10-CM | POA: Diagnosis not present

## 2015-01-04 DIAGNOSIS — Z5181 Encounter for therapeutic drug level monitoring: Secondary | ICD-10-CM | POA: Diagnosis not present

## 2015-01-04 DIAGNOSIS — E119 Type 2 diabetes mellitus without complications: Secondary | ICD-10-CM | POA: Diagnosis not present

## 2015-01-04 DIAGNOSIS — Z79891 Long term (current) use of opiate analgesic: Secondary | ICD-10-CM | POA: Diagnosis not present

## 2015-01-04 DIAGNOSIS — I10 Essential (primary) hypertension: Secondary | ICD-10-CM | POA: Diagnosis not present

## 2015-02-11 DIAGNOSIS — I10 Essential (primary) hypertension: Secondary | ICD-10-CM | POA: Diagnosis not present

## 2015-02-11 DIAGNOSIS — E119 Type 2 diabetes mellitus without complications: Secondary | ICD-10-CM | POA: Diagnosis not present

## 2015-02-11 DIAGNOSIS — F419 Anxiety disorder, unspecified: Secondary | ICD-10-CM | POA: Diagnosis not present

## 2015-02-11 DIAGNOSIS — G894 Chronic pain syndrome: Secondary | ICD-10-CM | POA: Diagnosis not present

## 2015-03-30 DIAGNOSIS — R1012 Left upper quadrant pain: Secondary | ICD-10-CM | POA: Diagnosis not present

## 2015-03-30 DIAGNOSIS — E7800 Pure hypercholesterolemia, unspecified: Secondary | ICD-10-CM | POA: Diagnosis not present

## 2015-03-30 DIAGNOSIS — K219 Gastro-esophageal reflux disease without esophagitis: Secondary | ICD-10-CM | POA: Diagnosis not present

## 2015-03-30 DIAGNOSIS — R079 Chest pain, unspecified: Secondary | ICD-10-CM | POA: Diagnosis not present

## 2015-03-30 DIAGNOSIS — I10 Essential (primary) hypertension: Secondary | ICD-10-CM | POA: Diagnosis not present

## 2015-03-30 DIAGNOSIS — J449 Chronic obstructive pulmonary disease, unspecified: Secondary | ICD-10-CM | POA: Diagnosis not present

## 2015-03-30 DIAGNOSIS — F17200 Nicotine dependence, unspecified, uncomplicated: Secondary | ICD-10-CM | POA: Diagnosis not present

## 2015-03-31 IMAGING — CR DG CHEST 2V
2 series · 2 of 2 positions shown · non-contrast
Comparison: CT Abdomen and Pelvis 03/14/2012 and earlier.

CLINICAL DATA: 38-year-old male with right side chest back pain and
nonproductive cough. Nausea. Initial encounter.

EXAM:
CHEST  2 VIEW

[w chest pa *]
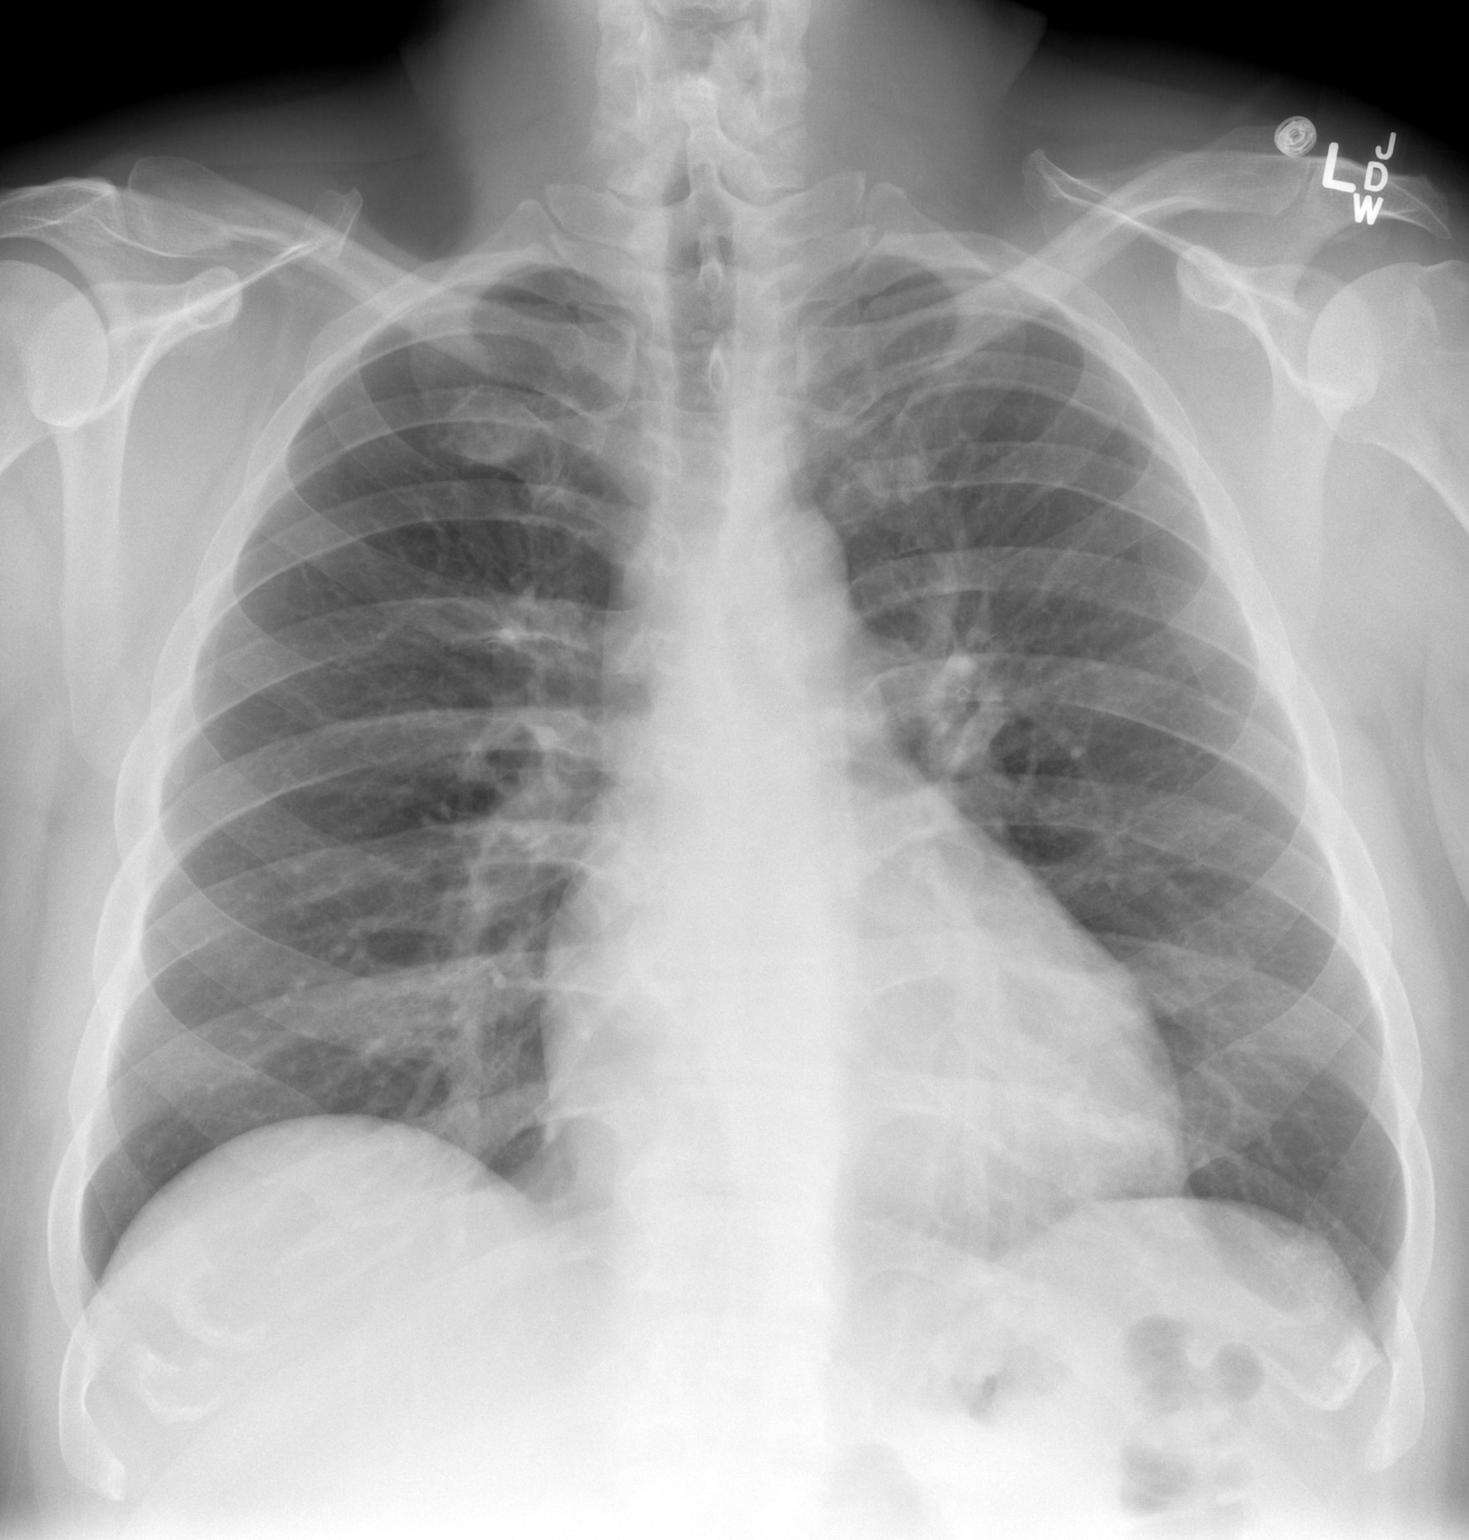

[w chest lat *]
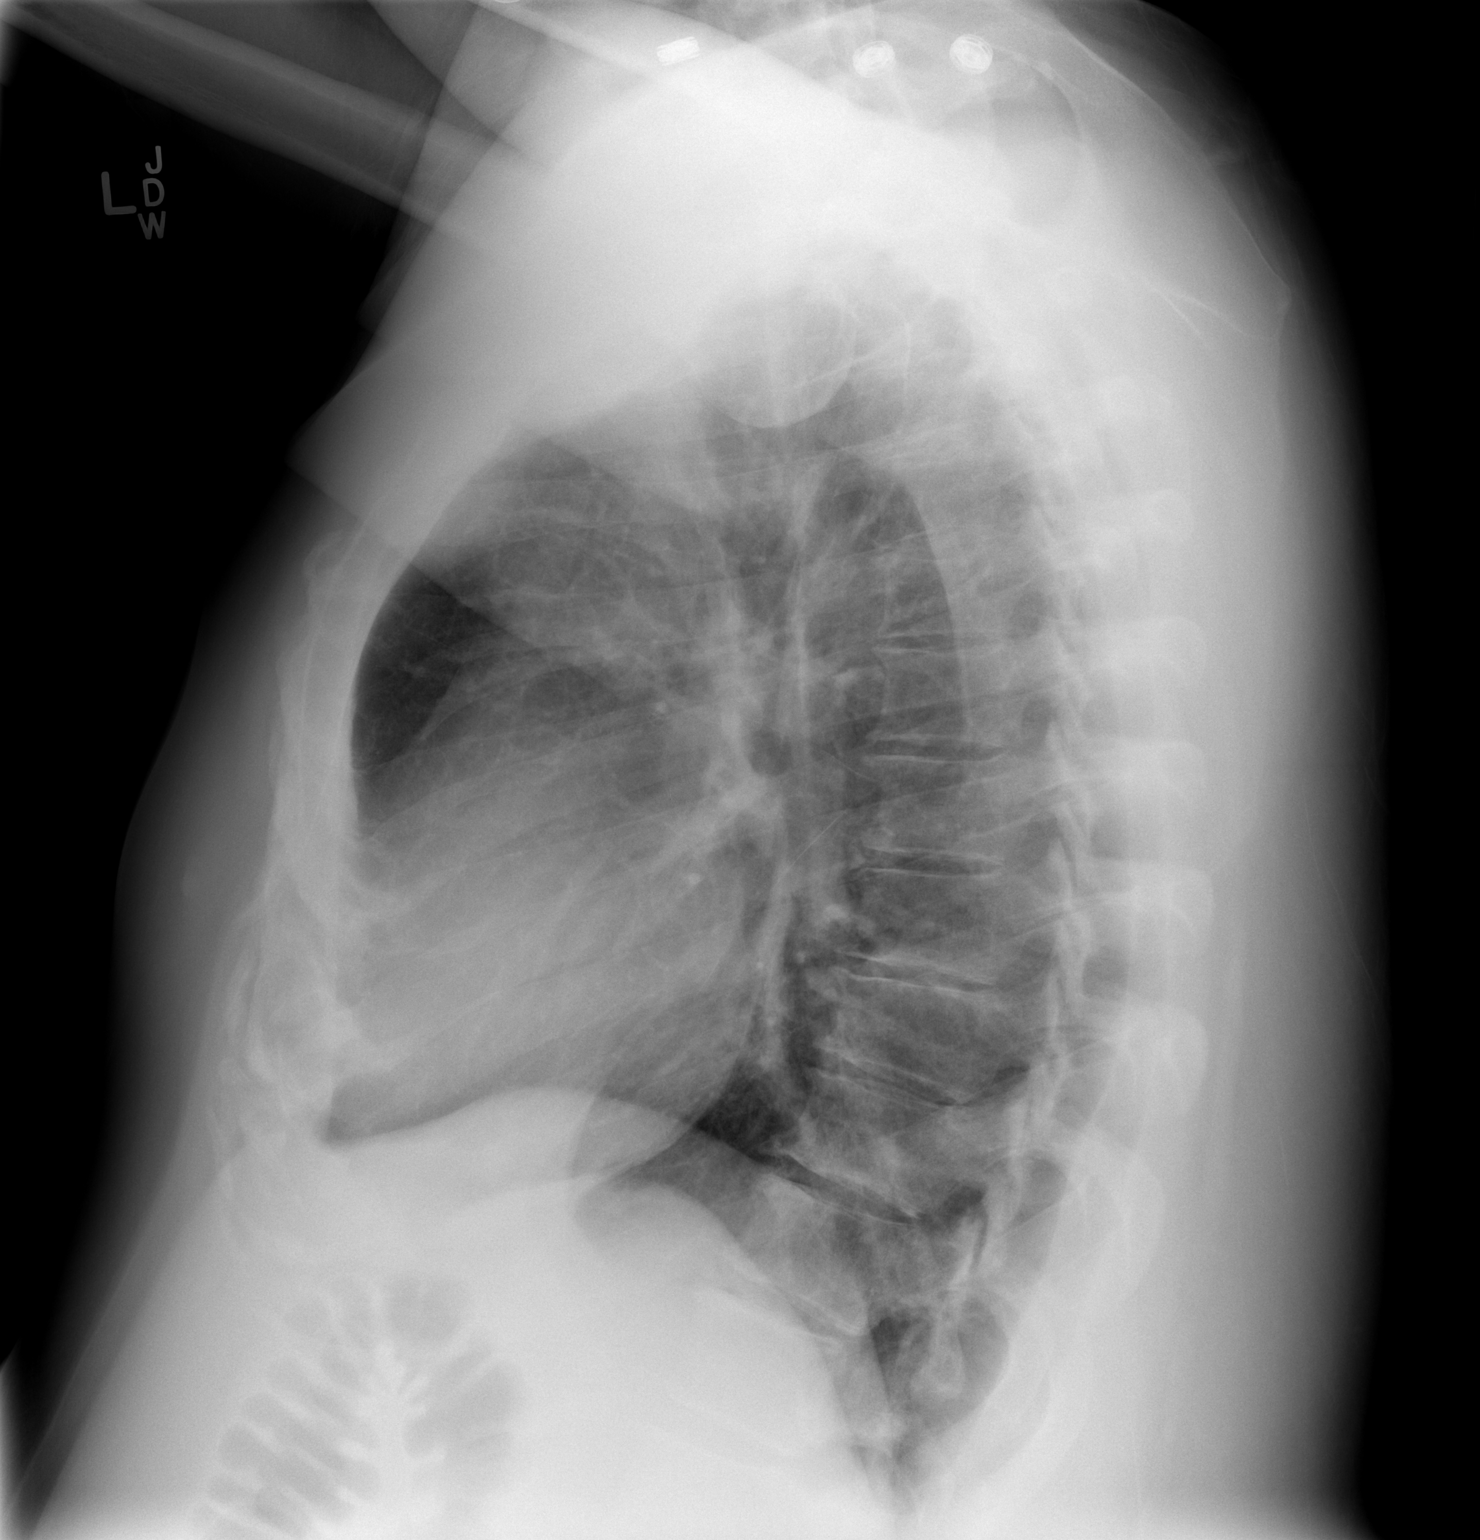

[2 of 2 positions shown; findings below may reference images not displayed]

FINDINGS: Stable and normal lung volumes. Stable cardiac size at the upper
limits of normal. Other mediastinal contours are within normal
limits. Visualized tracheal air column is within normal limits. The
lungs are clear. No pneumothorax or effusion. No acute osseous
abnormality identified.
IMPRESSION: No acute cardiopulmonary abnormality.

## 2015-04-26 IMAGING — CR DG THORACIC SPINE 2V
3 series · 3 of 3 positions shown · non-contrast
Comparison: CT, 09/21/2013

CLINICAL DATA: Fell last night.  Back pain.

EXAM:
THORACIC SPINE - 2 VIEW

[w swimmers view *]
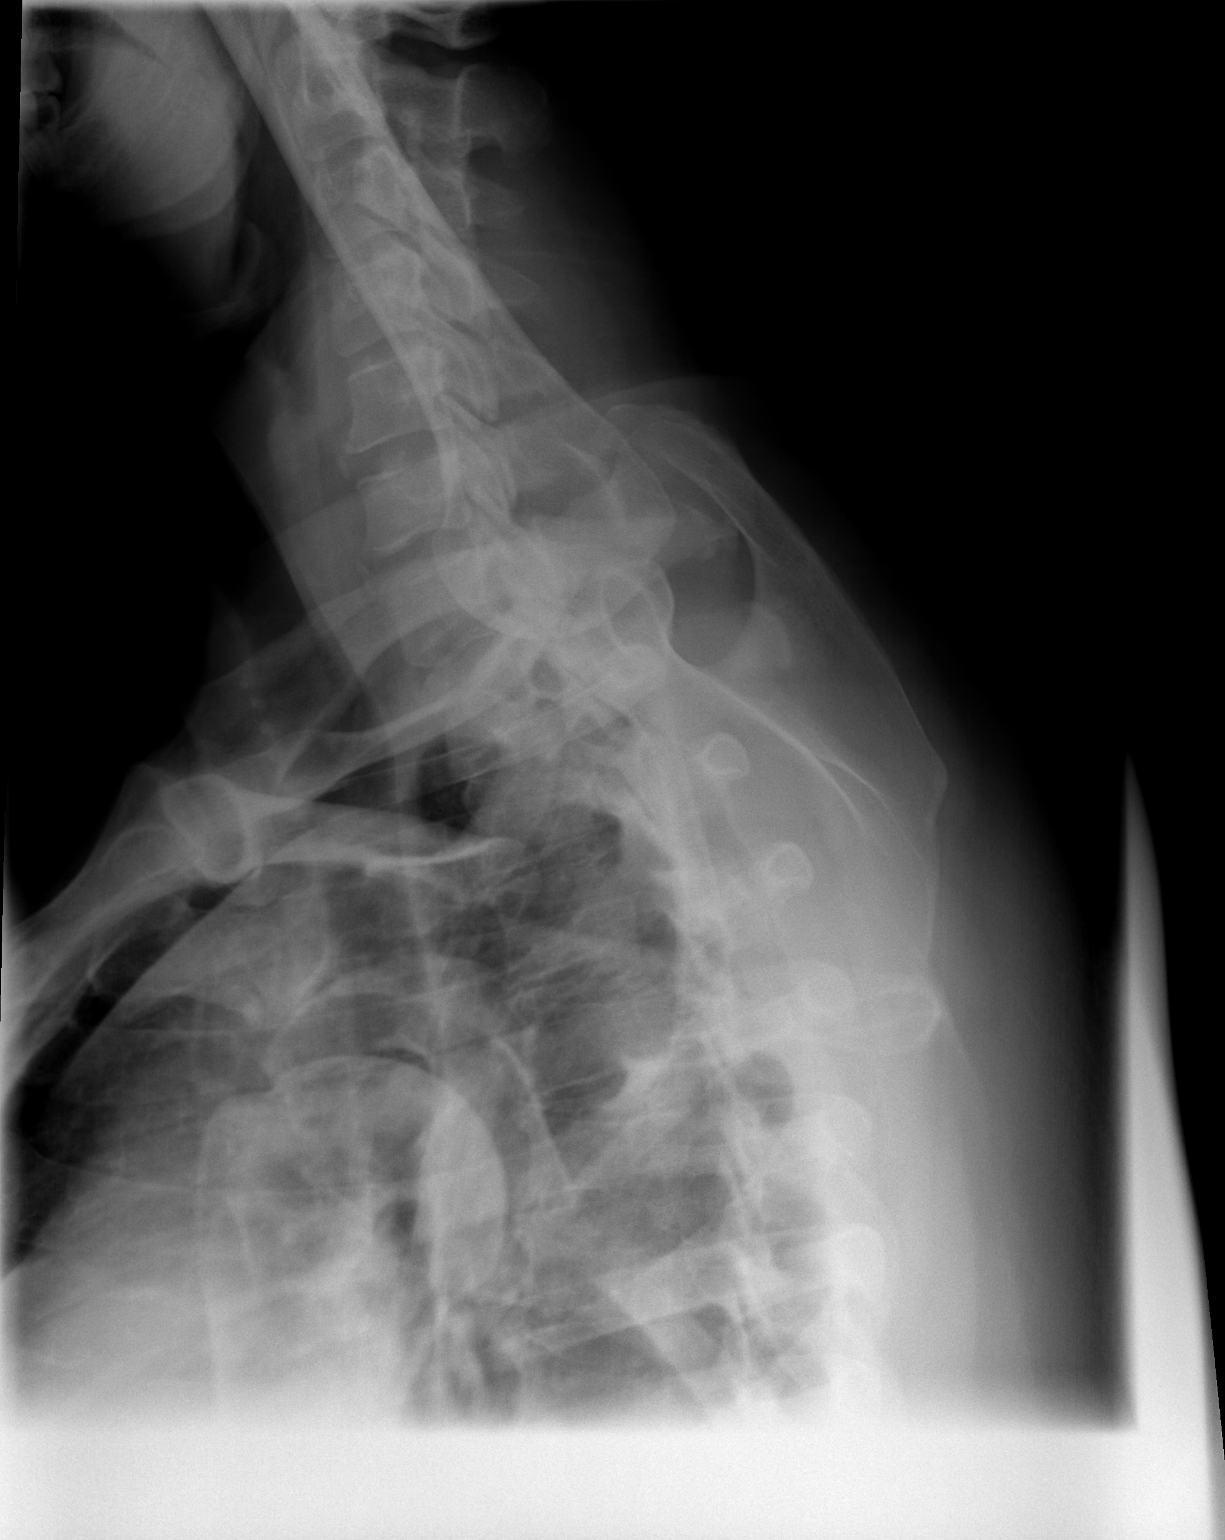

[t t-spine a.p.]
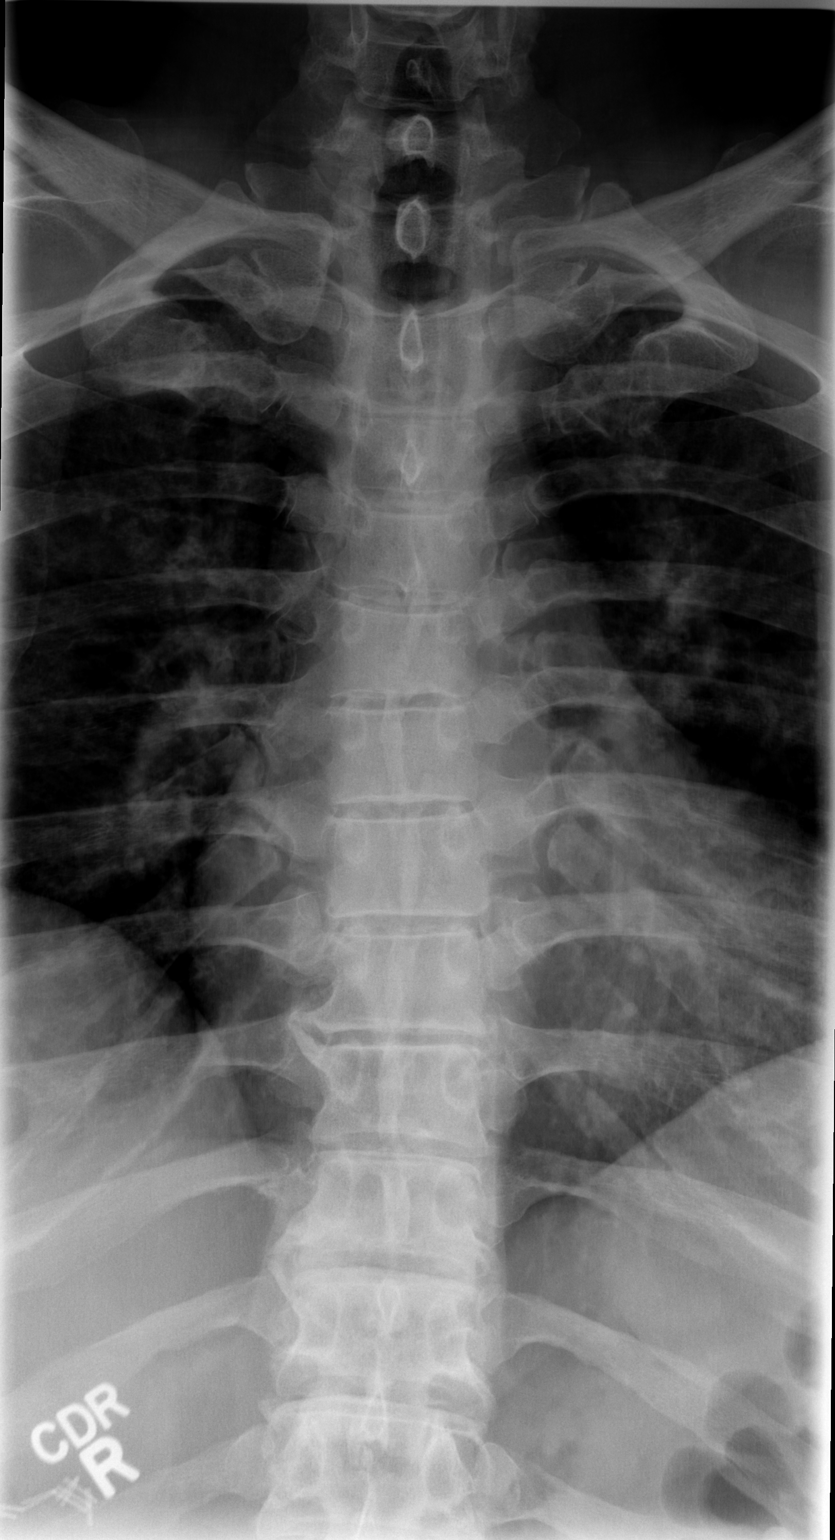

[t t-spine lat *]
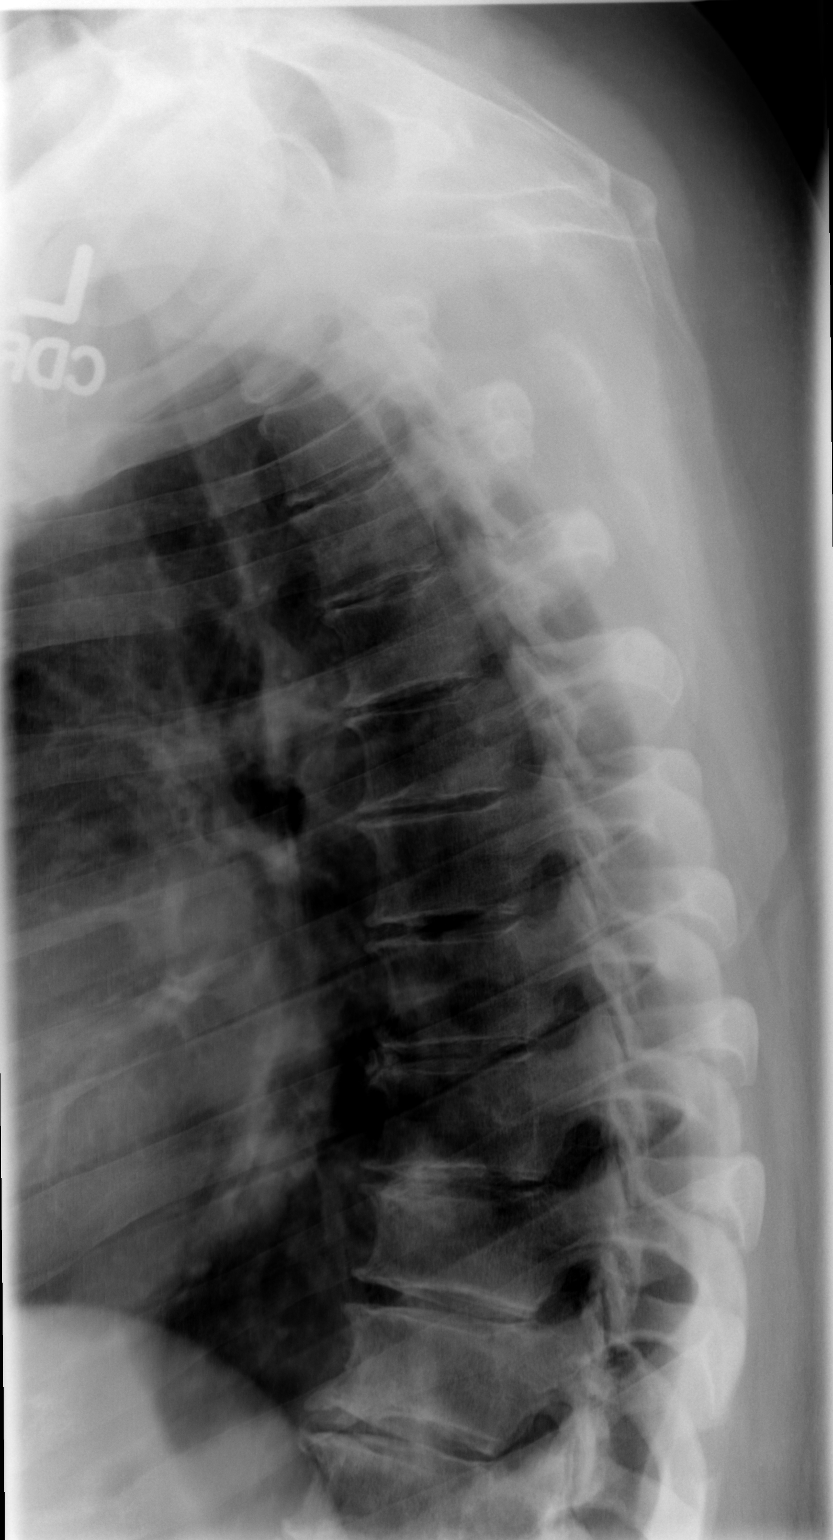

[3 of 3 positions shown; findings below may reference images not displayed]

FINDINGS: No fracture. No spondylolisthesis. There are degenerative changes
most evident along the lower thoracic spine with mild loss of disc
height and endplate spurring. No bone lesion. Soft tissues are
unremarkable.
IMPRESSION: No fracture or acute finding.

## 2015-06-02 DIAGNOSIS — G894 Chronic pain syndrome: Secondary | ICD-10-CM | POA: Diagnosis not present

## 2015-08-05 DIAGNOSIS — J449 Chronic obstructive pulmonary disease, unspecified: Secondary | ICD-10-CM | POA: Diagnosis not present

## 2015-08-05 DIAGNOSIS — K219 Gastro-esophageal reflux disease without esophagitis: Secondary | ICD-10-CM | POA: Diagnosis not present

## 2015-08-25 DIAGNOSIS — M79672 Pain in left foot: Secondary | ICD-10-CM | POA: Diagnosis not present

## 2015-08-25 DIAGNOSIS — M79642 Pain in left hand: Secondary | ICD-10-CM | POA: Diagnosis not present

## 2015-08-25 DIAGNOSIS — E11618 Type 2 diabetes mellitus with other diabetic arthropathy: Secondary | ICD-10-CM | POA: Diagnosis not present

## 2015-08-25 DIAGNOSIS — Z6841 Body Mass Index (BMI) 40.0 and over, adult: Secondary | ICD-10-CM | POA: Diagnosis not present

## 2015-08-25 DIAGNOSIS — E876 Hypokalemia: Secondary | ICD-10-CM | POA: Diagnosis not present

## 2015-08-25 DIAGNOSIS — M8660 Other chronic osteomyelitis, unspecified site: Secondary | ICD-10-CM | POA: Diagnosis not present

## 2015-08-25 DIAGNOSIS — E1140 Type 2 diabetes mellitus with diabetic neuropathy, unspecified: Secondary | ICD-10-CM | POA: Diagnosis not present

## 2015-08-25 DIAGNOSIS — I2510 Atherosclerotic heart disease of native coronary artery without angina pectoris: Secondary | ICD-10-CM | POA: Diagnosis not present

## 2015-08-25 DIAGNOSIS — T148 Other injury of unspecified body region: Secondary | ICD-10-CM | POA: Diagnosis not present

## 2015-08-25 DIAGNOSIS — M7989 Other specified soft tissue disorders: Secondary | ICD-10-CM | POA: Diagnosis not present

## 2015-08-25 DIAGNOSIS — I10 Essential (primary) hypertension: Secondary | ICD-10-CM | POA: Diagnosis not present

## 2015-08-26 DIAGNOSIS — E11618 Type 2 diabetes mellitus with other diabetic arthropathy: Secondary | ICD-10-CM | POA: Diagnosis present

## 2015-08-26 DIAGNOSIS — M19072 Primary osteoarthritis, left ankle and foot: Secondary | ICD-10-CM | POA: Diagnosis not present

## 2015-08-26 DIAGNOSIS — M79672 Pain in left foot: Secondary | ICD-10-CM | POA: Diagnosis present

## 2015-08-26 DIAGNOSIS — E1142 Type 2 diabetes mellitus with diabetic polyneuropathy: Secondary | ICD-10-CM | POA: Diagnosis not present

## 2015-08-26 DIAGNOSIS — Z79891 Long term (current) use of opiate analgesic: Secondary | ICD-10-CM | POA: Diagnosis not present

## 2015-08-26 DIAGNOSIS — F17210 Nicotine dependence, cigarettes, uncomplicated: Secondary | ICD-10-CM | POA: Diagnosis present

## 2015-08-26 DIAGNOSIS — E876 Hypokalemia: Secondary | ICD-10-CM | POA: Diagnosis not present

## 2015-08-26 DIAGNOSIS — Z6841 Body Mass Index (BMI) 40.0 and over, adult: Secondary | ICD-10-CM | POA: Diagnosis not present

## 2015-08-26 DIAGNOSIS — I2510 Atherosclerotic heart disease of native coronary artery without angina pectoris: Secondary | ICD-10-CM | POA: Diagnosis present

## 2015-08-26 DIAGNOSIS — I10 Essential (primary) hypertension: Secondary | ICD-10-CM | POA: Diagnosis not present

## 2015-08-26 DIAGNOSIS — M25475 Effusion, left foot: Secondary | ICD-10-CM | POA: Diagnosis not present

## 2015-08-26 DIAGNOSIS — R938 Abnormal findings on diagnostic imaging of other specified body structures: Secondary | ICD-10-CM | POA: Diagnosis not present

## 2015-08-26 DIAGNOSIS — M86172 Other acute osteomyelitis, left ankle and foot: Secondary | ICD-10-CM | POA: Diagnosis not present

## 2015-08-26 DIAGNOSIS — G5792 Unspecified mononeuropathy of left lower limb: Secondary | ICD-10-CM | POA: Diagnosis present

## 2015-08-26 DIAGNOSIS — E669 Obesity, unspecified: Secondary | ICD-10-CM | POA: Diagnosis present

## 2015-08-26 DIAGNOSIS — T148 Other injury of unspecified body region: Secondary | ICD-10-CM | POA: Diagnosis not present

## 2015-08-26 DIAGNOSIS — F603 Borderline personality disorder: Secondary | ICD-10-CM | POA: Diagnosis present

## 2015-08-26 DIAGNOSIS — E1140 Type 2 diabetes mellitus with diabetic neuropathy, unspecified: Secondary | ICD-10-CM | POA: Diagnosis present

## 2015-08-26 DIAGNOSIS — Z79899 Other long term (current) drug therapy: Secondary | ICD-10-CM | POA: Diagnosis not present

## 2015-09-15 DIAGNOSIS — J069 Acute upper respiratory infection, unspecified: Secondary | ICD-10-CM | POA: Diagnosis not present

## 2015-11-14 DIAGNOSIS — S0083XA Contusion of other part of head, initial encounter: Secondary | ICD-10-CM | POA: Diagnosis not present

## 2015-11-14 DIAGNOSIS — S39012A Strain of muscle, fascia and tendon of lower back, initial encounter: Secondary | ICD-10-CM | POA: Diagnosis not present

## 2015-11-14 DIAGNOSIS — S3992XA Unspecified injury of lower back, initial encounter: Secondary | ICD-10-CM | POA: Diagnosis not present

## 2015-11-14 DIAGNOSIS — M545 Low back pain: Secondary | ICD-10-CM | POA: Diagnosis not present

## 2015-11-15 DIAGNOSIS — S3992XA Unspecified injury of lower back, initial encounter: Secondary | ICD-10-CM | POA: Diagnosis not present

## 2015-11-15 DIAGNOSIS — M545 Low back pain: Secondary | ICD-10-CM | POA: Diagnosis not present

## 2015-12-02 DIAGNOSIS — K449 Diaphragmatic hernia without obstruction or gangrene: Secondary | ICD-10-CM | POA: Diagnosis not present

## 2015-12-02 DIAGNOSIS — I10 Essential (primary) hypertension: Secondary | ICD-10-CM | POA: Diagnosis not present

## 2015-12-02 DIAGNOSIS — G894 Chronic pain syndrome: Secondary | ICD-10-CM | POA: Diagnosis not present

## 2015-12-02 DIAGNOSIS — F419 Anxiety disorder, unspecified: Secondary | ICD-10-CM | POA: Diagnosis not present

## 2015-12-02 DIAGNOSIS — M1990 Unspecified osteoarthritis, unspecified site: Secondary | ICD-10-CM | POA: Diagnosis not present

## 2015-12-23 DIAGNOSIS — F17210 Nicotine dependence, cigarettes, uncomplicated: Secondary | ICD-10-CM | POA: Diagnosis not present

## 2015-12-23 DIAGNOSIS — R109 Unspecified abdominal pain: Secondary | ICD-10-CM | POA: Diagnosis not present

## 2015-12-23 DIAGNOSIS — Z79899 Other long term (current) drug therapy: Secondary | ICD-10-CM | POA: Diagnosis not present

## 2015-12-23 DIAGNOSIS — I10 Essential (primary) hypertension: Secondary | ICD-10-CM | POA: Diagnosis not present

## 2015-12-23 DIAGNOSIS — N23 Unspecified renal colic: Secondary | ICD-10-CM | POA: Diagnosis not present

## 2015-12-23 DIAGNOSIS — J449 Chronic obstructive pulmonary disease, unspecified: Secondary | ICD-10-CM | POA: Diagnosis not present

## 2015-12-23 DIAGNOSIS — M549 Dorsalgia, unspecified: Secondary | ICD-10-CM | POA: Diagnosis not present

## 2015-12-23 DIAGNOSIS — R05 Cough: Secondary | ICD-10-CM | POA: Diagnosis not present

## 2015-12-28 DIAGNOSIS — I10 Essential (primary) hypertension: Secondary | ICD-10-CM | POA: Diagnosis not present

## 2015-12-28 DIAGNOSIS — F419 Anxiety disorder, unspecified: Secondary | ICD-10-CM | POA: Diagnosis not present

## 2015-12-28 DIAGNOSIS — G894 Chronic pain syndrome: Secondary | ICD-10-CM | POA: Diagnosis not present

## 2015-12-28 DIAGNOSIS — J208 Acute bronchitis due to other specified organisms: Secondary | ICD-10-CM | POA: Diagnosis not present

## 2016-02-18 DIAGNOSIS — J040 Acute laryngitis: Secondary | ICD-10-CM | POA: Diagnosis not present

## 2016-02-18 DIAGNOSIS — J189 Pneumonia, unspecified organism: Secondary | ICD-10-CM | POA: Diagnosis not present

## 2016-02-18 DIAGNOSIS — J0100 Acute maxillary sinusitis, unspecified: Secondary | ICD-10-CM | POA: Diagnosis not present

## 2016-02-18 DIAGNOSIS — R0602 Shortness of breath: Secondary | ICD-10-CM | POA: Diagnosis not present

## 2016-03-02 DIAGNOSIS — M1991 Primary osteoarthritis, unspecified site: Secondary | ICD-10-CM | POA: Diagnosis not present

## 2016-03-02 DIAGNOSIS — F419 Anxiety disorder, unspecified: Secondary | ICD-10-CM | POA: Diagnosis not present

## 2016-03-02 DIAGNOSIS — G894 Chronic pain syndrome: Secondary | ICD-10-CM | POA: Diagnosis not present

## 2016-03-02 DIAGNOSIS — I973 Postprocedural hypertension: Secondary | ICD-10-CM | POA: Diagnosis not present

## 2016-03-30 DIAGNOSIS — J449 Chronic obstructive pulmonary disease, unspecified: Secondary | ICD-10-CM | POA: Diagnosis not present

## 2016-03-30 DIAGNOSIS — M5136 Other intervertebral disc degeneration, lumbar region: Secondary | ICD-10-CM | POA: Diagnosis not present

## 2016-03-30 DIAGNOSIS — F419 Anxiety disorder, unspecified: Secondary | ICD-10-CM | POA: Diagnosis not present

## 2016-03-30 DIAGNOSIS — G894 Chronic pain syndrome: Secondary | ICD-10-CM | POA: Diagnosis not present

## 2016-03-30 DIAGNOSIS — I10 Essential (primary) hypertension: Secondary | ICD-10-CM | POA: Diagnosis not present

## 2016-04-24 DIAGNOSIS — R109 Unspecified abdominal pain: Secondary | ICD-10-CM | POA: Diagnosis not present

## 2016-04-24 DIAGNOSIS — R0781 Pleurodynia: Secondary | ICD-10-CM | POA: Diagnosis not present

## 2016-04-24 DIAGNOSIS — S3991XA Unspecified injury of abdomen, initial encounter: Secondary | ICD-10-CM | POA: Diagnosis not present

## 2016-04-27 DIAGNOSIS — G894 Chronic pain syndrome: Secondary | ICD-10-CM | POA: Diagnosis not present

## 2016-04-27 DIAGNOSIS — I10 Essential (primary) hypertension: Secondary | ICD-10-CM | POA: Diagnosis not present

## 2016-04-27 DIAGNOSIS — M1991 Primary osteoarthritis, unspecified site: Secondary | ICD-10-CM | POA: Diagnosis not present

## 2016-04-27 DIAGNOSIS — G629 Polyneuropathy, unspecified: Secondary | ICD-10-CM | POA: Diagnosis not present

## 2016-04-27 DIAGNOSIS — F419 Anxiety disorder, unspecified: Secondary | ICD-10-CM | POA: Diagnosis not present

## 2016-05-01 DIAGNOSIS — M79604 Pain in right leg: Secondary | ICD-10-CM | POA: Diagnosis not present

## 2016-05-01 DIAGNOSIS — R531 Weakness: Secondary | ICD-10-CM | POA: Diagnosis not present

## 2016-05-01 DIAGNOSIS — M79605 Pain in left leg: Secondary | ICD-10-CM | POA: Diagnosis not present

## 2016-05-01 DIAGNOSIS — M79652 Pain in left thigh: Secondary | ICD-10-CM | POA: Diagnosis not present

## 2016-05-01 DIAGNOSIS — M25559 Pain in unspecified hip: Secondary | ICD-10-CM | POA: Diagnosis not present

## 2016-05-01 DIAGNOSIS — M791 Myalgia: Secondary | ICD-10-CM | POA: Diagnosis not present

## 2016-05-01 DIAGNOSIS — M79651 Pain in right thigh: Secondary | ICD-10-CM | POA: Diagnosis not present

## 2016-05-02 DIAGNOSIS — R531 Weakness: Secondary | ICD-10-CM | POA: Diagnosis not present

## 2016-05-03 DIAGNOSIS — R109 Unspecified abdominal pain: Secondary | ICD-10-CM | POA: Diagnosis not present

## 2016-05-03 DIAGNOSIS — R319 Hematuria, unspecified: Secondary | ICD-10-CM | POA: Diagnosis not present

## 2016-05-03 DIAGNOSIS — R1111 Vomiting without nausea: Secondary | ICD-10-CM | POA: Diagnosis not present

## 2016-08-16 DIAGNOSIS — K4090 Unilateral inguinal hernia, without obstruction or gangrene, not specified as recurrent: Secondary | ICD-10-CM | POA: Diagnosis not present

## 2016-08-16 DIAGNOSIS — R109 Unspecified abdominal pain: Secondary | ICD-10-CM | POA: Diagnosis not present

## 2016-09-07 DIAGNOSIS — L02413 Cutaneous abscess of right upper limb: Secondary | ICD-10-CM | POA: Diagnosis not present

## 2016-09-07 DIAGNOSIS — L0211 Cutaneous abscess of neck: Secondary | ICD-10-CM | POA: Diagnosis not present

## 2016-11-01 DIAGNOSIS — K4090 Unilateral inguinal hernia, without obstruction or gangrene, not specified as recurrent: Secondary | ICD-10-CM | POA: Diagnosis not present

## 2016-11-01 DIAGNOSIS — R109 Unspecified abdominal pain: Secondary | ICD-10-CM | POA: Diagnosis not present

## 2016-11-01 DIAGNOSIS — R319 Hematuria, unspecified: Secondary | ICD-10-CM | POA: Diagnosis not present

## 2016-11-02 DIAGNOSIS — K4090 Unilateral inguinal hernia, without obstruction or gangrene, not specified as recurrent: Secondary | ICD-10-CM | POA: Diagnosis not present

## 2016-11-14 DIAGNOSIS — R0602 Shortness of breath: Secondary | ICD-10-CM | POA: Diagnosis not present

## 2016-11-14 DIAGNOSIS — J069 Acute upper respiratory infection, unspecified: Secondary | ICD-10-CM | POA: Diagnosis not present

## 2016-11-14 DIAGNOSIS — R079 Chest pain, unspecified: Secondary | ICD-10-CM | POA: Diagnosis not present

## 2016-11-14 DIAGNOSIS — R05 Cough: Secondary | ICD-10-CM | POA: Diagnosis not present

## 2016-11-14 DIAGNOSIS — R0789 Other chest pain: Secondary | ICD-10-CM | POA: Diagnosis not present

## 2016-12-05 DIAGNOSIS — A4902 Methicillin resistant Staphylococcus aureus infection, unspecified site: Secondary | ICD-10-CM | POA: Diagnosis not present

## 2016-12-06 DIAGNOSIS — L0291 Cutaneous abscess, unspecified: Secondary | ICD-10-CM | POA: Diagnosis not present

## 2016-12-06 DIAGNOSIS — L0390 Cellulitis, unspecified: Secondary | ICD-10-CM | POA: Diagnosis not present

## 2016-12-20 DIAGNOSIS — N200 Calculus of kidney: Secondary | ICD-10-CM | POA: Diagnosis not present

## 2016-12-20 DIAGNOSIS — K521 Toxic gastroenteritis and colitis: Secondary | ICD-10-CM | POA: Diagnosis not present

## 2017-03-10 DIAGNOSIS — J0100 Acute maxillary sinusitis, unspecified: Secondary | ICD-10-CM | POA: Diagnosis not present

## 2017-04-10 DIAGNOSIS — Z125 Encounter for screening for malignant neoplasm of prostate: Secondary | ICD-10-CM | POA: Diagnosis not present

## 2017-04-10 DIAGNOSIS — R000 Tachycardia, unspecified: Secondary | ICD-10-CM | POA: Diagnosis not present

## 2017-04-10 DIAGNOSIS — I10 Essential (primary) hypertension: Secondary | ICD-10-CM | POA: Diagnosis not present

## 2017-04-10 DIAGNOSIS — Z0000 Encounter for general adult medical examination without abnormal findings: Secondary | ICD-10-CM | POA: Diagnosis not present

## 2017-04-10 DIAGNOSIS — E785 Hyperlipidemia, unspecified: Secondary | ICD-10-CM | POA: Diagnosis not present

## 2017-04-10 DIAGNOSIS — M545 Low back pain: Secondary | ICD-10-CM | POA: Diagnosis not present

## 2017-04-19 DIAGNOSIS — M545 Low back pain: Secondary | ICD-10-CM | POA: Diagnosis not present

## 2017-04-19 DIAGNOSIS — L0293 Carbuncle, unspecified: Secondary | ICD-10-CM | POA: Diagnosis not present

## 2017-04-19 DIAGNOSIS — J309 Allergic rhinitis, unspecified: Secondary | ICD-10-CM | POA: Diagnosis not present

## 2017-04-19 DIAGNOSIS — Z5181 Encounter for therapeutic drug level monitoring: Secondary | ICD-10-CM | POA: Diagnosis not present

## 2017-04-19 DIAGNOSIS — R42 Dizziness and giddiness: Secondary | ICD-10-CM | POA: Diagnosis not present

## 2017-04-19 DIAGNOSIS — R002 Palpitations: Secondary | ICD-10-CM | POA: Diagnosis not present

## 2017-04-19 DIAGNOSIS — D72829 Elevated white blood cell count, unspecified: Secondary | ICD-10-CM | POA: Diagnosis not present

## 2017-04-30 ENCOUNTER — Ambulatory Visit: Payer: Medicare Other | Admitting: Cardiology

## 2017-05-03 DIAGNOSIS — L0390 Cellulitis, unspecified: Secondary | ICD-10-CM | POA: Diagnosis not present

## 2017-05-03 DIAGNOSIS — R002 Palpitations: Secondary | ICD-10-CM

## 2017-05-03 DIAGNOSIS — R42 Dizziness and giddiness: Secondary | ICD-10-CM

## 2017-05-03 DIAGNOSIS — M545 Low back pain: Secondary | ICD-10-CM | POA: Diagnosis not present

## 2017-05-03 DIAGNOSIS — K2900 Acute gastritis without bleeding: Secondary | ICD-10-CM | POA: Diagnosis not present

## 2017-05-03 DIAGNOSIS — D500 Iron deficiency anemia secondary to blood loss (chronic): Secondary | ICD-10-CM | POA: Diagnosis not present

## 2017-05-03 DIAGNOSIS — D649 Anemia, unspecified: Secondary | ICD-10-CM | POA: Diagnosis not present

## 2017-05-07 ENCOUNTER — Ambulatory Visit: Payer: Medicare Other | Admitting: Cardiology

## 2017-05-10 ENCOUNTER — Encounter: Payer: Self-pay | Admitting: Cardiology

## 2017-05-10 ENCOUNTER — Ambulatory Visit (INDEPENDENT_AMBULATORY_CARE_PROVIDER_SITE_OTHER): Payer: Medicare Other | Admitting: Cardiology

## 2017-05-10 VITALS — BP 136/80 | HR 70 | Ht 69.0 in | Wt 309.1 lb

## 2017-05-10 DIAGNOSIS — R0683 Snoring: Secondary | ICD-10-CM | POA: Diagnosis not present

## 2017-05-10 DIAGNOSIS — F17200 Nicotine dependence, unspecified, uncomplicated: Secondary | ICD-10-CM

## 2017-05-10 DIAGNOSIS — R0609 Other forms of dyspnea: Secondary | ICD-10-CM | POA: Diagnosis not present

## 2017-05-10 DIAGNOSIS — R7303 Prediabetes: Secondary | ICD-10-CM | POA: Insufficient documentation

## 2017-05-10 DIAGNOSIS — R0789 Other chest pain: Secondary | ICD-10-CM | POA: Diagnosis not present

## 2017-05-10 DIAGNOSIS — R42 Dizziness and giddiness: Secondary | ICD-10-CM

## 2017-05-10 DIAGNOSIS — IMO0001 Reserved for inherently not codable concepts without codable children: Secondary | ICD-10-CM | POA: Insufficient documentation

## 2017-05-10 MED ORDER — ASPIRIN EC 81 MG PO TBEC: 81.0000 mg | DELAYED_RELEASE_TABLET | Freq: Every day | ORAL | 0 refills | Status: AC

## 2017-05-10 NOTE — Progress Notes (Signed)
Cardiology Consultation:    Date:  05/10/2017   ID:  Joseph Ruiz, DOB 15-Oct-1975, MRN [id number]  PCP:  Shelle Iron, MD  Cardiologist:  Gypsy Balsam, MD   Referring MD: Shelle Iron, MD   Chief Complaint  Patient presents with  . Referral    Dr. Laymond Purser  . Dizziness  . Palpitations  I am having shortness of breath  History of Present Illness:    Joseph Ruiz is a 42 y.o. male who is being seen today for the evaluation of shortness of breath at the request of Sistasis, Rosanne Ashing, MD.  For last few weeks to months he experienced exertional shortness of breath he said that his shortness of breath actually happens with exertion but also can happen at any time he described a situation that yesterday he was fixing his father lawnmower and developed shortness of breath.  Typically when he walks he need to slow down or sometimes even stop.  He denies having any chest pain associated with this sensation just shortness of breath.  He gained about 50 pounds with the.  Of last year.  Previously he lost significant amount of weight after he finally that he had borderline diabetes his diabetes become controlled with diet alone however he is back to his previous weight.  He described to have some chest pain.  However those pains typically happening at rest not related to exertion lasting for a few minutes there is no shortness of breath no sweating associated with this sensation.  He also described to have some dizziness have dizziness is not positional normally last for a few minutes.  He described kind of unsteadiness does not feel any palpitations or heart speeding up and slowing down.  He does snore and snores a lot.  I suspect he does have sleep apnea never been tested for it.  Past Medical History:  Diagnosis Date  . COPD (chronic obstructive pulmonary disease) (HCC)   . Diabetes mellitus without complication (HCC)   . Hypertension   . Kidney stone     Past Surgical History:    Procedure Laterality Date  . BACK SURGERY    . CHOLECYSTECTOMY    . GALLBLADDER SURGERY    . KIDNEY SURGERY  2010    Current Medications: Current Meds  Medication Sig  . atenolol (TENORMIN) 50 MG tablet Take 50 mg by mouth daily.   . fluticasone (FLONASE) 50 MCG/ACT nasal spray Place 2 sprays into both nostrils daily.  . mupirocin ointment (BACTROBAN) 2 % Apply 1 application topically 3 (three) times daily.   . pregabalin (LYRICA) 150 MG capsule Take 150 mg by mouth 2 (two) times daily.     Allergies:   Patient has no known allergies.   Social History   Socioeconomic History  . Marital status: Single    Spouse name: Not on file  . Number of children: Not on file  . Years of education: Not on file  . Highest education level: Not on file  Occupational History  . Not on file  Social Needs  . Financial resource strain: Not on file  . Food insecurity:    Worry: Not on file    Inability: Not on file  . Transportation needs:    Medical: Not on file    Non-medical: Not on file  Tobacco Use  . Smoking status: Current Every Day Smoker    Packs/day: 0.50    Types: Cigarettes  . Smokeless tobacco: Never Used  Substance and Sexual  Activity  . Alcohol use: No  . Drug use: No  . Sexual activity: Not Currently  Lifestyle  . Physical activity:    Days per week: Not on file    Minutes per session: Not on file  . Stress: Not on file  Relationships  . Social connections:    Talks on phone: Not on file    Gets together: Not on file    Attends religious service: Not on file    Active member of club or organization: Not on file    Attends meetings of clubs or organizations: Not on file    Relationship status: Not on file  Other Topics Concern  . Not on file  Social History Narrative  . Not on file     Family History: The patient's family history includes COPD in his father; Diabetes in his mother; Hypertension in his father, mother, and sister. ROS:   Please see the  history of present illness.    All 14 point review of systems negative except as described per history of present illness.  EKGs/Labs/Other Studies Reviewed:    The following studies were reviewed today: Laboratory tests reviewed his HDL is 130, HDL 43  EKG:  EKG is  ordered today.  The ekg ordered today demonstrates normal sinus rhythm normal P interval normal QS complex duration morphology no ST segment changes  Recent Labs: No results found for requested labs within last 8760 hours.  Recent Lipid Panel No results found for: CHOL, TRIG, HDL, CHOLHDL, VLDL, LDLCALC, LDLDIRECT  Physical Exam:    VS:  BP 136/80 (BP Location: Left Arm, Patient Position: Sitting, Cuff Size: Normal)   Pulse 70   Ht 5\' 9"  (1.753 m)   Wt (!) 309 lb 1.9 oz (140.2 kg)   SpO2 98%   BMI 45.65 kg/m     Wt Readings from Last 3 Encounters:  05/10/17 (!) 309 lb 1.9 oz (140.2 kg)  10/17/13 248 lb (112.5 kg)  09/21/13 260 lb (117.9 kg)     GEN:  Well nourished, well developed in no acute distress HEENT: Normal NECK: No JVD; No carotid bruits LYMPHATICS: No lymphadenopathy CARDIAC: RRR, no murmurs, no rubs, no gallops RESPIRATORY:  Clear to auscultation without rales, wheezing or rhonchi  ABDOMEN: Soft, non-tender, non-distended MUSCULOSKELETAL:  No edema; No deformity  SKIN: Warm and dry NEUROLOGIC:  Alert and oriented x 3 PSYCHIATRIC:  Normal affect   ASSESSMENT:    1. Dizziness   2. Dyspnea on exertion   3. Atypical chest pain   4. Borderline diabetes   5. Smoking   6. Snoring    PLAN:    In order of problems listed above:  1. Dyspnea on exertion: I will ask him to have an echocardiogram to assess left ventricular ejection fraction.  Because of multiple risk factors for coronary artery disease as well as some atypical symptoms stress test will be scheduled.  We will do stress echocardiogram. 2. Atypical chest pain: Plan as outlined above we will do stress echocardiogram 3. Smoking:  Obviously he was told to quit.  He understands the problem artery Down from 2 packs to about 1-1/2 I told him that the ultimate goal will be completely discontinue smoking. 4. Snoring: I strongly suspect sleep apnea.  In the future we will do sleep study. 5. Borderline diabetes: Hopefully he will be able to manage this with diet and weight management as well as exercises however first we need to assess his heart will do it  by doing echocardiogram and stress test. 6. Dizziness: Very nonspecific I think in the future we may consider doing a Holter monitor on him.   Medication Adjustments/Labs and Tests Ordered: Current medicines are reviewed at length with the patient today.  Concerns regarding medicines are outlined above.  No orders of the defined types were placed in this encounter.  No orders of the defined types were placed in this encounter.   Signed, Georgeanna Leaobert J. Alliya Marcon, MD, [facility] CenterFACC. 05/10/2017 10:08 AM    [facility]

## 2017-05-10 NOTE — Addendum Note (Signed)
Addended by: Ayesha MohairWELLS, Kyng Matlock E on: 05/10/2017 10:16 AM   Modules accepted: Orders

## 2017-05-10 NOTE — Patient Instructions (Signed)
Medication Instructions:  Your physician has recommended you make the following change in your medication:  START aspirin 81 mg enteric coated daily  Labwork: None  Testing/Procedures: You had an EKG today.  Your physician has requested that you have an echocardiogram. Echocardiography is a painless test that uses sound waves to create images of your heart. It provides your doctor with information about the size and shape of your heart and how well your heart's chambers and valves are working. This procedure takes approximately one hour. There are no restrictions for this procedure.  Your physician has requested that you have a stress echocardiogram. For further information please visit [email or web address]. Please follow instruction sheet as given.  Follow-Up: Your physician recommends that you schedule a follow-up appointment in: 1 month.  Any Other Special Instructions Will Be Listed Below (If Applicable).     If you need a refill on your cardiac medications before your next appointment, please call your pharmacy.

## 2017-05-18 ENCOUNTER — Ambulatory Visit (HOSPITAL_BASED_OUTPATIENT_CLINIC_OR_DEPARTMENT_OTHER): Payer: Medicare Other | Attending: Family Medicine

## 2017-06-06 ENCOUNTER — Other Ambulatory Visit: Payer: Self-pay

## 2017-06-06 ENCOUNTER — Ambulatory Visit (HOSPITAL_BASED_OUTPATIENT_CLINIC_OR_DEPARTMENT_OTHER)
Admission: RE | Admit: 2017-06-06 | Discharge: 2017-06-06 | Disposition: A | Discharge status: Home / Self Care | Payer: Medicare Other | Source: Ambulatory Visit | Attending: Cardiology | Admitting: Cardiology

## 2017-06-06 ENCOUNTER — Ambulatory Visit (HOSPITAL_BASED_OUTPATIENT_CLINIC_OR_DEPARTMENT_OTHER): Payer: Medicare Other

## 2017-06-06 DIAGNOSIS — E119 Type 2 diabetes mellitus without complications: Secondary | ICD-10-CM | POA: Diagnosis not present

## 2017-06-06 DIAGNOSIS — R0789 Other chest pain: Secondary | ICD-10-CM | POA: Insufficient documentation

## 2017-06-06 DIAGNOSIS — R0609 Other forms of dyspnea: Secondary | ICD-10-CM | POA: Insufficient documentation

## 2017-06-06 MED ORDER — TELMISARTAN 20 MG PO TABS: 20.0000 mg | ORAL_TABLET | Freq: Every day | ORAL | 0 refills | Status: DC

## 2017-06-06 MED ORDER — PERFLUTREN LIPID MICROSPHERE
1.0000 mL | INTRAVENOUS | Status: DC | PRN
  Filled 2017-06-06: qty 10

## 2017-06-06 NOTE — Telephone Encounter (Signed)
Informed patient that his new medication has been sent (micardis  daily) per Dr. Dulce Sellar based upon blood pressure response during stress echo. Patient was also informed that the echo does not need to be rescheduled per Dr. Dulce Sellar.

## 2017-06-06 NOTE — Progress Notes (Signed)
  Echocardiogram Echocardiogram Stress Test / limited echo has been performed. IV contrast use was needed but not possible due to inability to obtain IV access.   Joseph Ruiz 06/06/2017, 2:31 PM

## 2017-06-12 ENCOUNTER — Ambulatory Visit: Payer: Medicare Other | Admitting: Cardiology

## 2017-06-19 DIAGNOSIS — D649 Anemia, unspecified: Secondary | ICD-10-CM | POA: Diagnosis not present

## 2017-06-19 DIAGNOSIS — R05 Cough: Secondary | ICD-10-CM | POA: Diagnosis not present

## 2017-06-19 DIAGNOSIS — B354 Tinea corporis: Secondary | ICD-10-CM | POA: Diagnosis not present

## 2017-06-19 DIAGNOSIS — M545 Low back pain: Secondary | ICD-10-CM | POA: Diagnosis not present

## 2017-07-09 ENCOUNTER — Other Ambulatory Visit: Payer: Self-pay | Admitting: Cardiology

## 2017-08-14 DIAGNOSIS — S0511XA Contusion of eyeball and orbital tissues, right eye, initial encounter: Secondary | ICD-10-CM | POA: Diagnosis not present

## 2017-08-14 DIAGNOSIS — S0993XA Unspecified injury of face, initial encounter: Secondary | ICD-10-CM | POA: Diagnosis not present

## 2017-08-14 DIAGNOSIS — S0990XA Unspecified injury of head, initial encounter: Secondary | ICD-10-CM | POA: Diagnosis not present

## 2017-08-14 DIAGNOSIS — H05231 Hemorrhage of right orbit: Secondary | ICD-10-CM | POA: Diagnosis not present

## 2017-09-17 DIAGNOSIS — R0683 Snoring: Secondary | ICD-10-CM | POA: Diagnosis not present

## 2017-09-17 DIAGNOSIS — E7800 Pure hypercholesterolemia, unspecified: Secondary | ICD-10-CM | POA: Diagnosis not present

## 2017-09-17 DIAGNOSIS — J019 Acute sinusitis, unspecified: Secondary | ICD-10-CM | POA: Diagnosis not present

## 2017-09-17 DIAGNOSIS — M545 Low back pain: Secondary | ICD-10-CM | POA: Diagnosis not present

## 2017-09-17 DIAGNOSIS — R7309 Other abnormal glucose: Secondary | ICD-10-CM | POA: Diagnosis not present

## 2017-09-17 DIAGNOSIS — K219 Gastro-esophageal reflux disease without esophagitis: Secondary | ICD-10-CM | POA: Diagnosis not present

## 2017-09-17 DIAGNOSIS — I10 Essential (primary) hypertension: Secondary | ICD-10-CM | POA: Diagnosis not present

## 2017-10-08 DIAGNOSIS — J449 Chronic obstructive pulmonary disease, unspecified: Secondary | ICD-10-CM | POA: Diagnosis not present

## 2017-10-08 DIAGNOSIS — Z79891 Long term (current) use of opiate analgesic: Secondary | ICD-10-CM | POA: Diagnosis not present

## 2017-10-08 DIAGNOSIS — K219 Gastro-esophageal reflux disease without esophagitis: Secondary | ICD-10-CM | POA: Diagnosis not present

## 2017-10-08 DIAGNOSIS — I10 Essential (primary) hypertension: Secondary | ICD-10-CM | POA: Diagnosis not present

## 2017-10-08 DIAGNOSIS — R079 Chest pain, unspecified: Secondary | ICD-10-CM | POA: Diagnosis not present

## 2017-10-08 DIAGNOSIS — M545 Low back pain: Secondary | ICD-10-CM | POA: Diagnosis not present

## 2017-10-08 DIAGNOSIS — R0789 Other chest pain: Secondary | ICD-10-CM | POA: Diagnosis not present

## 2017-10-08 DIAGNOSIS — E119 Type 2 diabetes mellitus without complications: Secondary | ICD-10-CM | POA: Diagnosis not present

## 2017-10-08 DIAGNOSIS — F17210 Nicotine dependence, cigarettes, uncomplicated: Secondary | ICD-10-CM | POA: Diagnosis not present

## 2017-10-08 DIAGNOSIS — R0602 Shortness of breath: Secondary | ICD-10-CM | POA: Diagnosis not present

## 2017-10-08 DIAGNOSIS — Z7952 Long term (current) use of systemic steroids: Secondary | ICD-10-CM | POA: Diagnosis not present

## 2017-10-08 DIAGNOSIS — R51 Headache: Secondary | ICD-10-CM | POA: Diagnosis not present

## 2017-10-16 DIAGNOSIS — G629 Polyneuropathy, unspecified: Secondary | ICD-10-CM | POA: Diagnosis not present

## 2017-10-16 DIAGNOSIS — I10 Essential (primary) hypertension: Secondary | ICD-10-CM | POA: Diagnosis not present

## 2017-10-28 DIAGNOSIS — R52 Pain, unspecified: Secondary | ICD-10-CM | POA: Diagnosis not present

## 2017-10-28 DIAGNOSIS — R110 Nausea: Secondary | ICD-10-CM | POA: Diagnosis not present

## 2017-10-28 DIAGNOSIS — I10 Essential (primary) hypertension: Secondary | ICD-10-CM | POA: Diagnosis not present

## 2017-10-28 DIAGNOSIS — R1084 Generalized abdominal pain: Secondary | ICD-10-CM | POA: Diagnosis not present

## 2017-12-13 DIAGNOSIS — I10 Essential (primary) hypertension: Secondary | ICD-10-CM | POA: Diagnosis not present

## 2017-12-13 DIAGNOSIS — D72829 Elevated white blood cell count, unspecified: Secondary | ICD-10-CM | POA: Diagnosis not present

## 2017-12-13 DIAGNOSIS — F329 Major depressive disorder, single episode, unspecified: Secondary | ICD-10-CM | POA: Diagnosis not present

## 2017-12-13 DIAGNOSIS — M545 Low back pain: Secondary | ICD-10-CM | POA: Diagnosis not present

## 2018-01-21 DIAGNOSIS — I10 Essential (primary) hypertension: Secondary | ICD-10-CM | POA: Diagnosis not present

## 2018-01-21 DIAGNOSIS — F17200 Nicotine dependence, unspecified, uncomplicated: Secondary | ICD-10-CM | POA: Diagnosis not present

## 2018-01-21 DIAGNOSIS — F329 Major depressive disorder, single episode, unspecified: Secondary | ICD-10-CM | POA: Diagnosis not present

## 2018-01-21 DIAGNOSIS — M545 Low back pain: Secondary | ICD-10-CM | POA: Diagnosis not present

## 2019-11-04 ENCOUNTER — Emergency Department (HOSPITAL_COMMUNITY)
Admission: EM | Admit: 2019-11-04 | Discharge: 2019-11-05 | Disposition: A | Discharge status: Home / Self Care | Payer: Medicare Other | Attending: Emergency Medicine | Admitting: Emergency Medicine

## 2019-11-04 ENCOUNTER — Emergency Department (HOSPITAL_COMMUNITY): Payer: Medicare Other

## 2019-11-04 ENCOUNTER — Encounter (HOSPITAL_COMMUNITY): Payer: Self-pay

## 2019-11-04 DIAGNOSIS — F17210 Nicotine dependence, cigarettes, uncomplicated: Secondary | ICD-10-CM | POA: Insufficient documentation

## 2019-11-04 DIAGNOSIS — S3991XA Unspecified injury of abdomen, initial encounter: Secondary | ICD-10-CM | POA: Diagnosis present

## 2019-11-04 DIAGNOSIS — Z7982 Long term (current) use of aspirin: Secondary | ICD-10-CM | POA: Insufficient documentation

## 2019-11-04 DIAGNOSIS — Y92410 Unspecified street and highway as the place of occurrence of the external cause: Secondary | ICD-10-CM | POA: Insufficient documentation

## 2019-11-04 DIAGNOSIS — S0990XA Unspecified injury of head, initial encounter: Secondary | ICD-10-CM | POA: Diagnosis not present

## 2019-11-04 DIAGNOSIS — I10 Essential (primary) hypertension: Secondary | ICD-10-CM | POA: Insufficient documentation

## 2019-11-04 DIAGNOSIS — Y9389 Activity, other specified: Secondary | ICD-10-CM | POA: Insufficient documentation

## 2019-11-04 DIAGNOSIS — E119 Type 2 diabetes mellitus without complications: Secondary | ICD-10-CM | POA: Insufficient documentation

## 2019-11-04 DIAGNOSIS — R590 Localized enlarged lymph nodes: Secondary | ICD-10-CM | POA: Insufficient documentation

## 2019-11-04 DIAGNOSIS — S301XXA Contusion of abdominal wall, initial encounter: Secondary | ICD-10-CM | POA: Insufficient documentation

## 2019-11-04 LAB — CBC
HCT: 38.3 % — ABNORMAL LOW (ref 39.0–52.0)
Hemoglobin: 12.2 g/dL — ABNORMAL LOW (ref 13.0–17.0)
MCH: 28.8 pg (ref 26.0–34.0)
MCHC: 31.9 g/dL (ref 30.0–36.0)
MCV: 90.3 fL (ref 80.0–100.0)
Platelets: 239 10*3/uL (ref 150–400)
RBC: 4.24 MIL/uL (ref 4.22–5.81)
RDW: 14 % (ref 11.5–15.5)
WBC: 13.4 10*3/uL — ABNORMAL HIGH (ref 4.0–10.5)
nRBC: 0 % (ref 0.0–0.2)

## 2019-11-04 MED ORDER — HYDROMORPHONE HCL 1 MG/ML IJ SOLN
1.0000 mg | Freq: Once | INTRAMUSCULAR | Status: AC
  Administered 2019-11-04: 1 mg via INTRAVENOUS
  Filled 2019-11-04: qty 1

## 2019-11-04 MED ORDER — SODIUM CHLORIDE 0.9 % IV SOLN
INTRAVENOUS | Status: DC
  Administered 2019-11-04: 1000 mL via INTRAVENOUS

## 2019-11-04 MED ORDER — ONDANSETRON HCL 4 MG/2ML IJ SOLN
4.0000 mg | Freq: Once | INTRAMUSCULAR | Status: AC
  Administered 2019-11-04: 4 mg via INTRAVENOUS
  Filled 2019-11-04: qty 2

## 2019-11-04 MED ORDER — SODIUM CHLORIDE 0.9 % IV BOLUS
500.0000 mL | Freq: Once | INTRAVENOUS | Status: AC
  Administered 2019-11-04: 500 mL via INTRAVENOUS

## 2019-11-04 NOTE — ED Notes (Signed)
Patient transported to CT 
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
## 2019-11-05 ENCOUNTER — Emergency Department (HOSPITAL_COMMUNITY): Payer: Medicare Other

## 2019-11-05 DIAGNOSIS — S301XXA Contusion of abdominal wall, initial encounter: Secondary | ICD-10-CM | POA: Diagnosis not present

## 2019-11-05 LAB — HEMOGLOBIN AND HEMATOCRIT, BLOOD
HCT: 36.1 % — ABNORMAL LOW (ref 39.0–52.0)
Hemoglobin: 11.4 g/dL — ABNORMAL LOW (ref 13.0–17.0)

## 2019-11-05 LAB — BASIC METABOLIC PANEL
Anion gap: 10 (ref 5–15)
BUN: 19 mg/dL (ref 6–20)
CO2: 24 mmol/L (ref 22–32)
Calcium: 8.7 mg/dL — ABNORMAL LOW (ref 8.9–10.3)
Chloride: 106 mmol/L (ref 98–111)
Creatinine, Ser: 0.81 mg/dL (ref 0.61–1.24)
GFR, Estimated: >60 mL/min (ref >60)
Glucose, Bld: 87 mg/dL (ref 70–99)
Potassium: 4.1 mmol/L (ref 3.5–5.1)
Sodium: 140 mmol/L (ref 135–145)

## 2019-11-05 MED ORDER — OXYCODONE-ACETAMINOPHEN 5-325 MG PO TABS: 1.0000 | ORAL_TABLET | Freq: Four times a day (QID) | ORAL | 0 refills | Status: DC | PRN

## 2019-11-05 MED ORDER — HYDROMORPHONE HCL 1 MG/ML IJ SOLN
1.0000 mg | Freq: Once | INTRAMUSCULAR | Status: AC
  Administered 2019-11-05: 1 mg via INTRAVENOUS
  Filled 2019-11-05: qty 1

## 2019-11-05 MED ORDER — OXYCODONE-ACETAMINOPHEN 5-325 MG PO TABS
1.0000 | ORAL_TABLET | Freq: Once | ORAL | Status: AC
  Administered 2019-11-05: 1 via ORAL
  Filled 2019-11-05: qty 1
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
## 2019-11-05 NOTE — ED Notes (Signed)
Tried to ambulate pt. As pt got up to walk he was in a lot of pain and couldn't keep his balance for him to stand on his own ambulation failed.

## 2019-11-05 NOTE — ED Notes (Signed)
ED Provider at bedside. 
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
## 2022-02-02 ENCOUNTER — Emergency Department (HOSPITAL_COMMUNITY)
Admission: EM | Admit: 2022-02-02 | Discharge: 2022-02-02 | Disposition: A | Discharge status: Home / Self Care | Payer: 59 | Attending: Emergency Medicine | Admitting: Emergency Medicine

## 2022-02-02 ENCOUNTER — Emergency Department (HOSPITAL_COMMUNITY): Payer: 59

## 2022-02-02 ENCOUNTER — Encounter (HOSPITAL_COMMUNITY): Payer: Self-pay

## 2022-02-02 DIAGNOSIS — I83899 Varicose veins of unspecified lower extremities with other complications: Secondary | ICD-10-CM | POA: Diagnosis present

## 2022-02-02 DIAGNOSIS — Z7982 Long term (current) use of aspirin: Secondary | ICD-10-CM | POA: Insufficient documentation

## 2022-02-02 DIAGNOSIS — S91341A Puncture wound with foreign body, right foot, initial encounter: Secondary | ICD-10-CM

## 2022-02-02 DIAGNOSIS — W25XXXA Contact with sharp glass, initial encounter: Secondary | ICD-10-CM | POA: Diagnosis not present

## 2022-02-02 DIAGNOSIS — S91321A Laceration with foreign body, right foot, initial encounter: Secondary | ICD-10-CM | POA: Insufficient documentation

## 2022-02-02 HISTORY — DX: Acute embolism and thrombosis of unspecified deep veins of unspecified lower extremity: I82.409

## 2022-02-02 HISTORY — DX: Other intervertebral disc degeneration, lumbar region without mention of lumbar back pain or lower extremity pain: M51.369

## 2022-02-02 HISTORY — DX: Other intervertebral disc degeneration, lumbar region: M51.36

## 2022-02-02 MED ORDER — SILVER NITRATE-POT NITRATE 75-25 % EX MISC
1.0000 | Freq: Once | CUTANEOUS | Status: AC
  Administered 2022-02-02: 1 via TOPICAL
  Filled 2022-02-02: qty 10

## 2022-02-02 NOTE — ED Provider Triage Note (Signed)
Emergency Medicine Provider Triage Evaluation Note  Joseph Ruiz , a 47 y.o. male  was evaluated in triage.  Pt complains of bleeding varicose vein.  Started today when he moved suddenly.  History of DVT.  Also reports taking 3 Goody powders every day.   Physical Exam  BP (!) 157/96 (BP Location: Left Arm)   Pulse 66   Temp 98.3 F (36.8 C) (Oral)   Resp 18   SpO2 97%  Gen:   Awake, no distress   Resp:  Normal effort  MSK:   Moves extremities without difficulty  Other:  Actively bleeding and an squirting blood coming out of the patient's right lower extremity.  Medical Decision Making  Medically screening exam initiated at 3:53 PM.  Appropriate orders placed.  LARSEN DUNGAN was informed that the remainder of the evaluation will be completed by another provider, this initial triage assessment does not replace that evaluation, and the importance of remaining in the ED until their evaluation is complete.     Rhae Hammock, PA-C 02/02/22 1554

## 2022-02-02 NOTE — ED Provider Notes (Signed)
[facility] DEPT Provider Note   CSN: [id number] Arrival date & time: 02/02/22  1542     History  Chief Complaint  Patient presents with   Varicose Vein Bleeding    Joseph Ruiz is a 47 y.o. male.  HPI Patient presents with bleeding from varicose vein on his right lower leg.  Began to the shower today.  States the pain has gotten larger recently.  States there was a lot of squirting.  No lightheadedness dizziness.  States he is on [place] powders at home but no blood thinners.  States continue to bleed at home. Also states there is a cut on his right foot.  Worried there could be some foreign body in there.  States he did break a glass and thinks it could be broken glass over    Home Medications Prior to Admission medications   Medication Sig Start Date End Date Taking? Authorizing Provider  aspirin EC 81 MG tablet Take 1 tablet (81 mg total) by mouth daily. 05/10/17   Park Liter, MD  atenolol (TENORMIN) 50 MG tablet Take 50 mg by mouth daily.  09/01/13   [provider]  fluticasone (FLONASE) 50 MCG/ACT nasal spray Place 2 sprays into both nostrils daily.    [provider]  mupirocin ointment (BACTROBAN) 2 % Apply 1 application topically 3 (three) times daily.     [provider]  oxyCODONE-acetaminophen (PERCOCET/ROXICET) 5-325 MG tablet Take 1 tablet by mouth every 6 (six) hours as needed for severe pain. 11/05/19   Horton, Barbette Hair, MD  pregabalin (LYRICA) 150 MG capsule Take 150 mg by mouth 2 (two) times daily.    [provider]  telmisartan (MICARDIS) 20 MG tablet TAKE ONE (1) TABLET ONCE DAILY 07/09/17   Park Liter, MD      Allergies    Patient has no known allergies.    Review of Systems   Review of Systems  Physical Exam Updated Vital Signs BP 131/68   Pulse (!) 50   Temp 98.7 F (37.1 C) (Oral)   Resp 18   SpO2 98%  Physical Exam Vitals and nursing note reviewed.   Cardiovascular:     Rate and Rhythm: Regular rhythm.  Skin:    Capillary Refill: Capillary refill takes less than 2 seconds.     Comments: Right lateral lower leg has a bleeding varicose vein.  Dressing removed and had constant bleeding.  Not pulsatile.  Cautery attempted with silver nitrate but continued bleeding.  Redressed. On the lateral distal foot near the fifth MTP joint there is a small 1 cm laceration.  No erythema.  No drainage.  No bleeding.  Neurological:     Mental Status: He is alert.     ED Results / Procedures / Treatments   Labs (all labs ordered are listed, but only abnormal results are displayed) Labs Reviewed - No data to display  EKG None  Radiology DG Foot Complete Right  Result Date: 02/02/2022 CLINICAL DATA:  Foreign body EXAM: RIGHT FOOT COMPLETE - 3 VIEW COMPARISON:  06/29/2006. FINDINGS: Midfoot degenerative changes noted with osteophytes. Large posterior and small plantar calcaneal spurs. No acute fracture, dislocation or subluxation. Joint spaces are maintained. Wire shaped 6 mm metallic foreign body noted in the soft tissues of the medial foot at the level of the distal first metatarsal. IMPRESSION: 1. Radiopaque foreign body overlying the first metatarsal. 2. No acute osseous abnormalities. 3. Degenerative changes. Electronically Signed   By: Vonna Kotyk  Pleasure M.D.   On: 02/02/2022 17:03    Procedures Procedures    Medications Ordered in ED Medications  silver nitrate applicators applicator 1 Application (1 Application Topical Given by Other 02/02/22 1612)    ED Course/ Medical Decision Making/ A&P                           Medical Decision Making Amount and/or Complexity of Data Reviewed Radiology: ordered.  Risk Prescription drug management.   Patient with bleeding varicose vein on the right lower leg.  Attempted cautery with silver nitrate but still had bleeding.  Redressed.  Not on blood thinners.  Doubt had severe anemia from the  bleeding. Also small crack on foot.  Potentially laceration.  Will get x-ray to evaluate for foreign body although does not look infected at this time. X-ray did show potential metallic foreign body however at a different area on his foot.  This is over the first metatarsal.  There is with further inspection a little laceration of the area not to to 3 mm long.  No erythema.  Will have follow-up with podiatry.         Final Clinical Impression(s) / ED Diagnoses Final diagnoses:  Bleeding from varicose vein  Puncture wound of right foot with foreign body, initial encounter    Rx / DC Orders ED Discharge Orders     None         Davonna Belling, MD 02/02/22 2335

## 2022-02-02 NOTE — ED Triage Notes (Signed)
Pt arrives via [facility] c/o varicose vein bleeding to R shin after hitting it in the shower. Bleeding controlled by bandage. During triage bandage taken down by PA and reapplied due to continued bleeding. Per pt he takes 3 goody powders daily for his chronic back pain.

## 2022-04-04 ENCOUNTER — Ambulatory Visit (HOSPITAL_COMMUNITY)
Admission: EM | Admit: 2022-04-04 | Discharge: 2022-04-04 | Disposition: A | Discharge status: Home / Self Care | Payer: 59 | Attending: Nurse Practitioner | Admitting: Nurse Practitioner

## 2022-04-04 DIAGNOSIS — F22 Delusional disorders: Secondary | ICD-10-CM

## 2022-04-04 DIAGNOSIS — F1290 Cannabis use, unspecified, uncomplicated: Secondary | ICD-10-CM | POA: Diagnosis present

## 2022-04-04 DIAGNOSIS — Z5900 Homelessness unspecified: Secondary | ICD-10-CM | POA: Diagnosis not present

## 2022-04-04 DIAGNOSIS — F32A Depression, unspecified: Secondary | ICD-10-CM | POA: Insufficient documentation

## 2022-04-04 DIAGNOSIS — F419 Anxiety disorder, unspecified: Secondary | ICD-10-CM | POA: Diagnosis not present

## 2022-04-04 DIAGNOSIS — F603 Borderline personality disorder: Secondary | ICD-10-CM | POA: Diagnosis not present

## 2022-04-04 DIAGNOSIS — R440 Auditory hallucinations: Secondary | ICD-10-CM

## 2022-04-04 NOTE — ED Provider Notes (Signed)
Behavioral Health Urgent Care Medical Screening Exam  Patient Name: Joseph Ruiz MRN: [id number] Date of Evaluation: 04/05/22 Chief Complaint:   Diagnosis:  Final diagnoses:  Auditory hallucination  Homelessness unspecified  Marijuana use, continuous  Paranoia ([place])    History of Present illness: Joseph Ruiz is a 47 y.o. male.  Psychiatric history of anxiety, depression, borderline personality disorder, opioid use disorder, and accidental overdose of heroin who was brought in voluntarily by his sisters as a walk-in to [facility] complaints of paranoia and auditory hallucinations.  Patient was seen face-to-face by this provider separately and chart reviewed. Patient also gave permission for provider to obtain collateral information from his sisters.  Per chart review 03/11/22, patient presented to the ED due to accidental overdose of heroine and 01/29/2022, patient also presented to the ED for drug induced hallucinosis.    Patient reports "Well, my sisters thought I should come here and stay.  I live in a 2 story house with my dad, no one lives upstairs, and last night, I was hearing things, people walking upstairs, I had them last night walking and saying things, I can't make out what they say, it's voices, and back in January, I thought I heard something outside, and I knocked two windows because I was not thinking, I thought I was protecting me and my dad, but I really was not thinking, it's funny now, but it wasn't funny then".  Patient reports "I have not slept well in 10 years, that's why I smoke weed to help me relax. I'm diagnosed with depression, insomnia, borderline personality disorder and anxiety and I also take gabapentin and Suboxone from the pain clinic in [place].  Patient reports he is currently not established with outpatient psychiatric services for medication management, but gets seen at [facility] in [place] for Suboxone and therapy for opioid treatment.  Patient  reports he saw his therapist 2 weeks ago and would like to establish care with an outpatient psychiatrist preferably in the [place] area.  Patient endorses daily marijuana use and reports he last smoked this morning. Patient denies other illicit substance use.   Patient denies history of suicide attempts.  Patient denies history of inpatient psychiatric hospitalization. Patient denies access to guns or weapons.  Support, encouragement, reassurance provided about ongoing stressors.  Patient is provided with opportunity for questions.  On evaluation, patient is alert, oriented x 4, and cooperative. Speech is clear, normal rate, and coherent. Pt appears casual. Eye contact is good. Mood is Euthymic, affect is congruent with mood. Thought process is coherent and thought content is WDL. Pt denies SI/HI/AVH. There is no objective indication that the patient is responding to internal stimuli. No delusions elicited during this assessment.    Collateral information was obtained from the patient's sisters, who reports the patient was at the hospital a few days ago and got some antibiotics because he had an infection,and only marijuana was found in his system.  They report the patient has refused to stay at home and has gone to rent a motel because he is paranoid, and reporting hearing people walking upstairs and seeing shadows on the couch and around.  They report the patient busted out some windows at the house in January, and threw away all the food in the refrigerator due to same paranoia because he thought people had put things in the food to kill him.   They insist on the patient being admitted here because he has nowhere to go and they have  to go to work in the am, and they are unable to stay with the patient back at home because the patient and their dad are smokers and they are non-smokers.  Discussed recommendation for follow-up with outpatient psychiatric services for medication management  as preferred by the patient in the [place]/ area. Resources provided to both sisters.  Discussed [facility] outpatient walk-in clinic options, which patient refused because he lives in [place], and prefersto see a provider in the [place]/ area.  Patient is also provided with area homeless/shelter resources.  [facility] ED from 04/04/2022 in [facility] ED from 02/02/2022 in [facility] Emergency Department at [place] No Risk No Risk       Psychiatric Specialty Exam  Presentation  General Appearance:Casual  Eye Contact:Good  Speech:Clear and Coherent  Speech Volume:Normal  Handedness:Right   Mood and Affect  Mood: Euthymic  Affect: Congruent   Thought Process  Thought Processes: Coherent  Descriptions of Associations:Intact  Orientation:Full (Time, Place and Person)  Thought Content:WDL    Hallucinations:Auditory Pt reports " I heard people walking upstairs last night".  Ideas of Reference:Paranoia  Suicidal Thoughts:No  Homicidal Thoughts:No   Sensorium  Memory: Immediate Fair  Judgment: Intact  Insight: Fair   Community education officer  Concentration: Good  Attention Span: Good  Recall: Good  Fund of Knowledge: Good  Language: Good   Psychomotor Activity  Psychomotor Activity: Normal   Assets  Assets: Communication Skills; Desire for Improvement; Resilience   Sleep  Sleep: Poor  Number of hours: No data recorded  Physical Exam: Physical Exam Constitutional:      General: He is not in acute distress.    Appearance: He is not diaphoretic.  HENT:     Head: Normocephalic.     Right Ear: External ear normal.     Left Ear: External ear normal.     Nose: No congestion.  Eyes:     General:        Right eye: No discharge.        Left eye: No discharge.  Cardiovascular:     Rate and Rhythm: Normal rate.  Pulmonary:     Effort: No  respiratory distress.  Chest:     Chest wall: No tenderness.  Neurological:     Mental Status: He is alert and oriented to person, place, and time.  Psychiatric:        Attention and Perception: Attention and perception normal.        Mood and Affect: Mood and affect normal.        Speech: Speech normal.        Behavior: Behavior is cooperative.        Thought Content: Thought content normal.        Cognition and Memory: Cognition and memory normal.    Review of Systems  Constitutional:  Negative for chills, diaphoresis and fever.  HENT:  Negative for congestion.   Eyes:  Negative for discharge.  Respiratory:  Negative for cough, shortness of breath and wheezing.   Cardiovascular:  Negative for chest pain and palpitations.  Gastrointestinal:  Negative for diarrhea, nausea and vomiting.  Neurological:  Negative for tingling, seizures, loss of consciousness and headaches.  Psychiatric/Behavioral:  Positive for substance abuse. Negative for depression and suicidal ideas.    Blood pressure (!) 142/80, pulse 70, temperature 97.9 F (36.6 C), temperature source Oral, resp. rate 18, SpO2 98 %. There is no height or weight  on file to calculate BMI.  Musculoskeletal: Strength & Muscle Tone: within normal limits Gait & Station: normal Patient leans: N/A   [facility] MSE Discharge Disposition for Follow up and Recommendations: Based on my evaluation the patient does not appear to have an emergency medical condition and can be discharged with resources and follow up care in outpatient services for Medication Management and Individual Therapy  Recommend discharge home and follow-up with outpatient psychiatric services for medication management. Patient and his sisters are provided with resources.   Recommend continue outpatient substance abuse treatment services and therapy with current outpatient provider.  Patient verbalizes understanding.  Patient does not meet inpatient psychiatric admission  criteria or IVC criteria at this time.  There is no evidence of imminent risk of harm to self or others.  Discussed methods to reduce the risk of self-injury or suicide attempts: Frequent conversations regarding unsafe thoughts. Remove all significant sharps. Remove all firearms. Remove all medications, including over-the-counter meds. Consider lockbox for medications and having a responsible person dispense medications until patient has strengthened coping skills. Room checks for sharps or other harmful objects. Secure all chemical substances that can be ingested or inhaled.   Please refrain from using alcohol or illicit substances, as they can affect your mood and can cause depression, anxiety or other concerning symptoms.  Alcohol can increase the chance that a person will make reckless decisions, like attempting suicide, and can increase the lethality of a drug overdose.    Discussed crisis plan, calling 911, or going to the ED if condition changes or worsens.  Patient verbalizes understanding.  Patient discharged home and condition at discharge is stable.  Randon Goldsmith, NP 04/05/2022, 12:14 AM

## 2022-04-04 NOTE — ED Triage Notes (Signed)
Pt presents to [facility] voluntarily, accompanied by his family with complaint of hallucinations, nightmares and insomnia. Pt reports hearing sounds in his home such as footsteps coming up his stairs and hearing voices in his sleep. Per chart, pt has history of accidental drug overdose (heroine) last month and was treated at [facility] endorses marijuana use and suboxone. Pt currently denies SI,HI.

## 2022-04-04 NOTE — ED Provider Notes (Incomplete)
Behavioral Health Urgent Care Medical Screening Exam  Patient Name: Joseph Ruiz MRN: [id number] Date of Evaluation: 04/04/22 Chief Complaint:   Diagnosis:  Final diagnoses:  Auditory hallucination  Homelessness unspecified    History of Present illness: Joseph Ruiz is a 47 y.o. male.  Psychiatric history of anxiety, depression, borderline personality disorder, opioid use disorder, and accidental overdose of heroin who was brought in voluntarily by his sisters as a walk-in to [facility] complaints of paranoia and auditory hallucinations.  Patient was seen face-to-face by this provider separately and chart reviewed. Patient also gave permission for provider to obtain collateral information from his sisters.  Per chart review 03/11/22, patient presented to the ED due to accidental overdose of heroine and 01/29/2022, patient also presented to the ED for drug induced hallucinosis.    Patient reports " I live in a 2 story house with my dad, no one lives upstairs, last night, I was hearing things, people walking upstairs, I had them last night walking and saying things, I can't make out what they say, it's voices, and back in January, I thought I heard something outside, and I knocked out a couple of windows because I was not thinking, I thought I was protecting me and my dad, but I really was not thinking, it's funny now, but it wasn't funny then".  Patient reports "I have not slept well in 10 years, I'm diagnosed with depression, insomnia, borderline personality disorder and anxiety and I also take gabapentin and Suboxone from the pain clinic in [place].  Patient reports he is currently not established with outpatient psychiatric services for medication management, but [facility] [place] for [facility] and therapy for opioid use disorder.  Patient reports he saw his therapist 2 weeks ago.  Patient endorses daily marijuana use and reports he last smoked this  morning.  Patient denies history of suicide attempts.  Patient denies access to guns or weapons.  Support, encouragement, reassurance provided about ongoing stressors.  Patient is provided with opportunity for questions.  On evaluation, patient is alert, oriented x 4, and cooperative. Speech is clear, normal rate, and coherent. Pt appears casual. Eye contact is good. Mood is Euthymic, affect is congruent with mood. Thought process is coherent and thought content is WDL. Pt denies SI/HI/AVH. There is no objective indication that the patient is responding to internal stimuli. No delusions elicited during this assessment.    Collateral information was obtained from the patient's sisters, who reports the patient to go to the hospital a few days ago and get some antibiotics because he had an infection,and only marijuana was found in his system, they report the patient has refused to stay at home and has gone to rent a motel because he is paranoid, and reporting hearing people walking upstairs and seeing shadows on the couch and around.  They report the patient busted out some windows at the house in January, and threw away all the food in the refrigerator due to same paranoia because he thought people had put things in the food to kill him.   They insist on the patient being admitted here because he has nowhere to go and they have to go to work in the am, and they are unable to stay with the patient back at home because the patient and their dad are smokers and they are non-smokers.  Recommendation for follow-up with outpatient psychiatric services as preferred by the patient in the [place] area.  [facility] see  outpatient walk-in clinic options  [facility] ED from 04/04/2022 in [facility] ED from 02/02/2022 in [facility] Emergency Department at [place] No Risk No Risk       Psychiatric Specialty Exam  Presentation  General  Appearance:Casual  Eye Contact:Good  Speech:Clear and Coherent  Speech Volume:Normal  Handedness:Right   Mood and Affect  Mood: Euthymic  Affect: Congruent   Thought Process  Thought Processes: Coherent  Descriptions of Associations:Intact  Orientation:Full (Time, Place and Person)  Thought Content:WDL    Hallucinations:Auditory Pt reports " I heard people walking upstairs last night".  Ideas of Reference:Paranoia  Suicidal Thoughts:No  Homicidal Thoughts:No   Sensorium  Memory: Immediate Fair  Judgment: Intact  Insight: Fair   Community education officer  Concentration: Good  Attention Span: Good  Recall: Good  Fund of Knowledge: Good  Language: Good   Psychomotor Activity  Psychomotor Activity: Normal   Assets  Assets: Communication Skills; Desire for Improvement; Resilience   Sleep  Sleep: Poor  Number of hours: No data recorded  Physical Exam: Physical Exam Constitutional:      General: He is not in acute distress.    Appearance: He is not diaphoretic.  HENT:     Head: Normocephalic.     Right Ear: External ear normal.     Left Ear: External ear normal.     Nose: No congestion.  Eyes:     General:        Right eye: No discharge.        Left eye: No discharge.  Cardiovascular:     Rate and Rhythm: Normal rate.  Pulmonary:     Effort: No respiratory distress.  Chest:     Chest wall: No tenderness.  Neurological:     Mental Status: He is alert and oriented to person, place, and time.  Psychiatric:        Attention and Perception: Attention and perception normal.        Mood and Affect: Mood and affect normal.        Speech: Speech normal.        Behavior: Behavior is cooperative.        Thought Content: Thought content normal.        Cognition and Memory: Cognition and memory normal.    Review of Systems  Constitutional:  Negative for chills, diaphoresis and fever.  HENT:  Negative for congestion.   Eyes:   Negative for discharge.  Respiratory:  Negative for cough, shortness of breath and wheezing.   Cardiovascular:  Negative for chest pain and palpitations.  Gastrointestinal:  Negative for diarrhea, nausea and vomiting.  Neurological:  Negative for tingling, seizures, loss of consciousness and headaches.  Psychiatric/Behavioral:  Positive for substance abuse. Negative for depression and suicidal ideas.    Blood pressure (!) 142/80, pulse 70, temperature 97.9 F (36.6 C), temperature source Oral, resp. rate 18, SpO2 98 %. There is no height or weight on file to calculate BMI.  Musculoskeletal: Strength & Muscle Tone: within normal limits Gait & Station: normal Patient leans: N/A   [place] MSE Discharge Disposition for Follow up and Recommendations: Based on my evaluation the patient does not appear to have an emergency medical condition and can be discharged with resources and follow up care in outpatient services for Medication Management and Individual Therapy   Katherina Wimer Carolan Shiver, NP 04/04/2022, 11:35 PM

## 2022-04-04 NOTE — Discharge Instructions (Addendum)

## 2022-07-16 ENCOUNTER — Inpatient Hospital Stay (HOSPITAL_COMMUNITY)
Admission: EM | Admit: 2022-07-16 | Discharge: 2022-07-20 | DRG: 690 | Disposition: A | Discharge status: Skilled Nursing Facility | Payer: 59 | Attending: Internal Medicine | Admitting: Internal Medicine

## 2022-07-16 ENCOUNTER — Emergency Department (HOSPITAL_COMMUNITY): Payer: 59

## 2022-07-16 ENCOUNTER — Other Ambulatory Visit: Payer: Self-pay

## 2022-07-16 ENCOUNTER — Encounter (HOSPITAL_COMMUNITY): Payer: Self-pay

## 2022-07-16 DIAGNOSIS — R5383 Other fatigue: Secondary | ICD-10-CM | POA: Diagnosis present

## 2022-07-16 DIAGNOSIS — I10 Essential (primary) hypertension: Secondary | ICD-10-CM | POA: Diagnosis present

## 2022-07-16 DIAGNOSIS — S069X9A Unspecified intracranial injury with loss of consciousness of unspecified duration, initial encounter: Secondary | ICD-10-CM | POA: Diagnosis present

## 2022-07-16 DIAGNOSIS — G8929 Other chronic pain: Secondary | ICD-10-CM | POA: Diagnosis present

## 2022-07-16 DIAGNOSIS — M5136 Other intervertebral disc degeneration, lumbar region: Secondary | ICD-10-CM | POA: Diagnosis present

## 2022-07-16 DIAGNOSIS — R55 Syncope and collapse: Secondary | ICD-10-CM | POA: Diagnosis present

## 2022-07-16 DIAGNOSIS — E1140 Type 2 diabetes mellitus with diabetic neuropathy, unspecified: Secondary | ICD-10-CM | POA: Diagnosis present

## 2022-07-16 DIAGNOSIS — E860 Dehydration: Secondary | ICD-10-CM | POA: Diagnosis present

## 2022-07-16 DIAGNOSIS — N19 Unspecified kidney failure: Principal | ICD-10-CM

## 2022-07-16 DIAGNOSIS — N390 Urinary tract infection, site not specified: Secondary | ICD-10-CM | POA: Diagnosis not present

## 2022-07-16 DIAGNOSIS — Z8249 Family history of ischemic heart disease and other diseases of the circulatory system: Secondary | ICD-10-CM

## 2022-07-16 DIAGNOSIS — B9620 Unspecified Escherichia coli [E. coli] as the cause of diseases classified elsewhere: Secondary | ICD-10-CM | POA: Diagnosis present

## 2022-07-16 DIAGNOSIS — I959 Hypotension, unspecified: Secondary | ICD-10-CM | POA: Diagnosis present

## 2022-07-16 DIAGNOSIS — J449 Chronic obstructive pulmonary disease, unspecified: Secondary | ICD-10-CM | POA: Diagnosis present

## 2022-07-16 DIAGNOSIS — E8721 Acute metabolic acidosis: Secondary | ICD-10-CM | POA: Diagnosis present

## 2022-07-16 DIAGNOSIS — K30 Functional dyspepsia: Secondary | ICD-10-CM | POA: Diagnosis present

## 2022-07-16 DIAGNOSIS — N179 Acute kidney failure, unspecified: Secondary | ICD-10-CM | POA: Diagnosis present

## 2022-07-16 DIAGNOSIS — Z751 Person awaiting admission to adequate facility elsewhere: Secondary | ICD-10-CM

## 2022-07-16 DIAGNOSIS — F17210 Nicotine dependence, cigarettes, uncomplicated: Secondary | ICD-10-CM | POA: Diagnosis present

## 2022-07-16 DIAGNOSIS — Z87442 Personal history of urinary calculi: Secondary | ICD-10-CM

## 2022-07-16 DIAGNOSIS — D649 Anemia, unspecified: Secondary | ICD-10-CM | POA: Diagnosis present

## 2022-07-16 DIAGNOSIS — Z6836 Body mass index (BMI) 36.0-36.9, adult: Secondary | ICD-10-CM

## 2022-07-16 DIAGNOSIS — E669 Obesity, unspecified: Secondary | ICD-10-CM | POA: Diagnosis present

## 2022-07-16 DIAGNOSIS — Z833 Family history of diabetes mellitus: Secondary | ICD-10-CM

## 2022-07-16 DIAGNOSIS — Z79899 Other long term (current) drug therapy: Secondary | ICD-10-CM

## 2022-07-16 DIAGNOSIS — R110 Nausea: Secondary | ICD-10-CM | POA: Diagnosis present

## 2022-07-16 DIAGNOSIS — W19XXXA Unspecified fall, initial encounter: Secondary | ICD-10-CM | POA: Diagnosis present

## 2022-07-16 DIAGNOSIS — Z7982 Long term (current) use of aspirin: Secondary | ICD-10-CM

## 2022-07-16 DIAGNOSIS — Z91199 Patient's noncompliance with other medical treatment and regimen due to unspecified reason: Secondary | ICD-10-CM

## 2022-07-16 DIAGNOSIS — Z825 Family history of asthma and other chronic lower respiratory diseases: Secondary | ICD-10-CM

## 2022-07-16 DIAGNOSIS — R197 Diarrhea, unspecified: Secondary | ICD-10-CM | POA: Diagnosis present

## 2022-07-16 DIAGNOSIS — R262 Difficulty in walking, not elsewhere classified: Secondary | ICD-10-CM | POA: Diagnosis present

## 2022-07-16 DIAGNOSIS — Z9049 Acquired absence of other specified parts of digestive tract: Secondary | ICD-10-CM

## 2022-07-16 LAB — BASIC METABOLIC PANEL
Anion gap: 11 (ref 5–15)
BUN: 47 mg/dL — ABNORMAL HIGH (ref 6–20)
CO2: 18 mmol/L — ABNORMAL LOW (ref 22–32)
Calcium: 8.1 mg/dL — ABNORMAL LOW (ref 8.9–10.3)
Chloride: 108 mmol/L (ref 98–111)
Creatinine, Ser: 2.93 mg/dL — ABNORMAL HIGH (ref 0.61–1.24)
GFR, Estimated: 26 mL/min — ABNORMAL LOW (ref >60)
Glucose, Bld: 119 mg/dL — ABNORMAL HIGH (ref 70–99)
Potassium: 3.6 mmol/L (ref 3.5–5.1)
Sodium: 137 mmol/L (ref 135–145)

## 2022-07-16 LAB — COMPREHENSIVE METABOLIC PANEL
ALT: 25 U/L (ref 0–44)
AST: 25 U/L (ref 15–41)
Albumin: 4.2 g/dL (ref 3.5–5.0)
Alkaline Phosphatase: 90 U/L (ref 38–126)
Anion gap: 15 (ref 5–15)
BUN: 46 mg/dL — ABNORMAL HIGH (ref 6–20)
CO2: 20 mmol/L — ABNORMAL LOW (ref 22–32)
Calcium: 9 mg/dL (ref 8.9–10.3)
Chloride: 100 mmol/L (ref 98–111)
Creatinine, Ser: 4.62 mg/dL — ABNORMAL HIGH (ref 0.61–1.24)
GFR, Estimated: 15 mL/min — ABNORMAL LOW (ref >60)
Glucose, Bld: 118 mg/dL — ABNORMAL HIGH (ref 70–99)
Potassium: 3.5 mmol/L (ref 3.5–5.1)
Sodium: 135 mmol/L (ref 135–145)
Total Bilirubin: 0.3 mg/dL (ref 0.3–1.2)
Total Protein: 8.7 g/dL — ABNORMAL HIGH (ref 6.5–8.1)

## 2022-07-16 LAB — CBC WITH DIFFERENTIAL/PLATELET
Abs Immature Granulocytes: 0.06 10*3/uL (ref 0.00–0.07)
Basophils Absolute: 0.1 10*3/uL (ref 0.0–0.1)
Basophils Relative: 1 %
Eosinophils Absolute: 0.2 10*3/uL (ref 0.0–0.5)
Eosinophils Relative: 1 %
HCT: 42.6 % (ref 39.0–52.0)
Hemoglobin: 13.8 g/dL (ref 13.0–17.0)
Immature Granulocytes: 0 %
Lymphocytes Relative: 23 %
Lymphs Abs: 3.6 10*3/uL (ref 0.7–4.0)
MCH: 28.5 pg (ref 26.0–34.0)
MCHC: 32.4 g/dL (ref 30.0–36.0)
MCV: 88 fL (ref 80.0–100.0)
Monocytes Absolute: 1.2 10*3/uL — ABNORMAL HIGH (ref 0.1–1.0)
Monocytes Relative: 7 %
Neutro Abs: 10.5 10*3/uL — ABNORMAL HIGH (ref 1.7–7.7)
Neutrophils Relative %: 68 %
Platelets: 365 10*3/uL (ref 150–400)
RBC: 4.84 MIL/uL (ref 4.22–5.81)
RDW: 14.5 % (ref 11.5–15.5)
WBC: 15.6 10*3/uL — ABNORMAL HIGH (ref 4.0–10.5)
nRBC: 0 % (ref 0.0–0.2)

## 2022-07-16 LAB — URINALYSIS, W/ REFLEX TO CULTURE (INFECTION SUSPECTED)
Bilirubin Urine: NEGATIVE
Glucose, UA: NEGATIVE mg/dL
Ketones, ur: NEGATIVE mg/dL
Nitrite: NEGATIVE
Protein, ur: 100 mg/dL — AB
Specific Gravity, Urine: 1.024 (ref 1.005–1.030)
WBC, UA: >50 WBC/hpf (ref 0–5)
pH: 5 (ref 5.0–8.0)

## 2022-07-16 LAB — CK: Total CK: 36 U/L — ABNORMAL LOW (ref 49–397)

## 2022-07-16 LAB — TROPONIN I (HIGH SENSITIVITY)
Troponin I (High Sensitivity): 10 ng/L (ref <18)
Troponin I (High Sensitivity): 8 ng/L (ref <18)

## 2022-07-16 LAB — HIV ANTIBODY (ROUTINE TESTING W REFLEX): HIV Screen 4th Generation wRfx: NONREACTIVE

## 2022-07-16 LAB — HEMOGLOBIN A1C
Hgb A1c MFr Bld: 6.1 % — ABNORMAL HIGH (ref 4.8–5.6)
Mean Plasma Glucose: 128.37 mg/dL

## 2022-07-16 LAB — LIPASE, BLOOD: Lipase: 54 U/L — ABNORMAL HIGH (ref 11–51)

## 2022-07-16 LAB — PROTIME-INR
INR: 1 (ref 0.8–1.2)
Prothrombin Time: 13.1 seconds (ref 11.4–15.2)

## 2022-07-16 LAB — LACTIC ACID, PLASMA: Lactic Acid, Venous: 1.2 mmol/L (ref 0.5–1.9)

## 2022-07-16 MED ORDER — GABAPENTIN 300 MG PO CAPS
300.0000 mg | ORAL_CAPSULE | Freq: Three times a day (TID) | ORAL | Status: DC
  Administered 2022-07-16 – 2022-07-17 (×4): 300 mg via ORAL
  Filled 2022-07-16 (×4): qty 1

## 2022-07-16 MED ORDER — SODIUM CHLORIDE 0.9 % IV BOLUS
1000.0000 mL | Freq: Once | INTRAVENOUS | Status: AC
  Administered 2022-07-16: 1000 mL via INTRAVENOUS

## 2022-07-16 MED ORDER — ONDANSETRON HCL 4 MG/2ML IJ SOLN
4.0000 mg | Freq: Four times a day (QID) | INTRAMUSCULAR | Status: DC | PRN
  Administered 2022-07-16 – 2022-07-20 (×6): 4 mg via INTRAVENOUS
  Filled 2022-07-16 (×8): qty 2

## 2022-07-16 MED ORDER — SERTRALINE HCL 50 MG PO TABS: 50.0000 mg | ORAL_TABLET | Freq: Every day | ORAL | Status: DC

## 2022-07-16 MED ORDER — SODIUM CHLORIDE 0.9 % IV SOLN
2.0000 g | Freq: Once | INTRAVENOUS | Status: AC
  Administered 2022-07-16: 2 g via INTRAVENOUS
  Filled 2022-07-16: qty 20

## 2022-07-16 MED ORDER — SODIUM CHLORIDE 0.9 % IV SOLN: INTRAVENOUS | Status: DC

## 2022-07-16 MED ORDER — ONDANSETRON HCL 4 MG PO TABS
4.0000 mg | ORAL_TABLET | Freq: Four times a day (QID) | ORAL | Status: DC | PRN
  Administered 2022-07-18 – 2022-07-19 (×4): 4 mg via ORAL
  Filled 2022-07-16 (×4): qty 1

## 2022-07-16 MED ORDER — FLUTICASONE PROPIONATE 50 MCG/ACT NA SUSP: 2.0000 | Freq: Every day | NASAL | Status: DC

## 2022-07-16 MED ORDER — HEPARIN SODIUM (PORCINE) 5000 UNIT/ML IJ SOLN
5000.0000 [IU] | Freq: Three times a day (TID) | INTRAMUSCULAR | Status: DC
  Administered 2022-07-16 – 2022-07-20 (×13): 5000 [IU] via SUBCUTANEOUS
  Filled 2022-07-16 (×13): qty 1

## 2022-07-16 MED ORDER — OXYCODONE HCL 5 MG PO TABS
5.0000 mg | ORAL_TABLET | ORAL | Status: DC | PRN
  Administered 2022-07-16 – 2022-07-20 (×15): 5 mg via ORAL
  Filled 2022-07-16 (×15): qty 1

## 2022-07-16 MED ORDER — ACETAMINOPHEN 650 MG RE SUPP: 650.0000 mg | Freq: Four times a day (QID) | RECTAL | Status: DC | PRN

## 2022-07-16 MED ORDER — ORAL CARE MOUTH RINSE: 15.0000 mL | OROMUCOSAL | Status: DC | PRN

## 2022-07-16 MED ORDER — ALBUTEROL SULFATE (2.5 MG/3ML) 0.083% IN NEBU: 2.5000 mg | INHALATION_SOLUTION | RESPIRATORY_TRACT | Status: DC | PRN

## 2022-07-16 MED ORDER — OLANZAPINE 5 MG PO TABS: 5.0000 mg | ORAL_TABLET | Freq: Every day | ORAL | Status: DC

## 2022-07-16 MED ORDER — ACETAMINOPHEN 325 MG PO TABS
650.0000 mg | ORAL_TABLET | Freq: Four times a day (QID) | ORAL | Status: DC | PRN
  Administered 2022-07-16 – 2022-07-17 (×2): 650 mg via ORAL
  Filled 2022-07-16 (×2): qty 2

## 2022-07-16 MED ORDER — ENSURE ENLIVE PO LIQD
237.0000 mL | Freq: Two times a day (BID) | ORAL | Status: DC
  Administered 2022-07-16 – 2022-07-19 (×5): 237 mL via ORAL

## 2022-07-16 MED ORDER — FENTANYL CITRATE PF 50 MCG/ML IJ SOSY
12.5000 ug | PREFILLED_SYRINGE | Freq: Once | INTRAMUSCULAR | Status: AC
  Administered 2022-07-16: 12.5 ug via INTRAVENOUS
  Filled 2022-07-16: qty 1

## 2022-07-16 MED ORDER — FENTANYL CITRATE PF 50 MCG/ML IJ SOSY
50.0000 ug | PREFILLED_SYRINGE | Freq: Once | INTRAMUSCULAR | Status: AC | PRN
  Administered 2022-07-16: 50 ug via INTRAVENOUS
  Filled 2022-07-16: qty 1

## 2022-07-16 MED ORDER — ASPIRIN 81 MG PO TBEC
81.0000 mg | DELAYED_RELEASE_TABLET | Freq: Every day | ORAL | Status: DC
  Administered 2022-07-16 – 2022-07-20 (×5): 81 mg via ORAL
  Filled 2022-07-16 (×5): qty 1

## 2022-07-16 MED ORDER — METOPROLOL TARTRATE 5 MG/5ML IV SOLN: 5.0000 mg | Freq: Four times a day (QID) | INTRAVENOUS | Status: DC | PRN

## 2022-07-16 MED ORDER — SODIUM CHLORIDE 0.9 % IV SOLN
2.0000 g | INTRAVENOUS | Status: DC
  Administered 2022-07-17 – 2022-07-18 (×2): 2 g via INTRAVENOUS
  Filled 2022-07-16 (×2): qty 20

## 2022-07-16 NOTE — ED Triage Notes (Signed)
Pt arrives via EMS for N/V/D since last night with a syncopal episode approx. 2200 where pt reports he does not remember hitting the floor while standing. Pt also reports pain in R frontal, neck, and R side but says the R side pain was present prior to falling last night, and blurred vision. Pt states he has continued to feel as if he will pass out for multiple episodes since syncope last night. GCS 15. + Hematoma to R Frontal. Pt denies thinners but states he does take several BC powders daily. EMS reports initial BP for them 68/46 despite unsuccessful IV attempts pt BP improved on its own to 98/46.

## 2022-07-16 NOTE — ED Notes (Signed)
Lab called to add on hemoglobin A1C. 

## 2022-07-16 NOTE — ED Notes (Signed)
U/S present.

## 2022-07-16 NOTE — Plan of Care (Signed)

## 2022-07-16 NOTE — ED Provider Notes (Signed)
Care of patient assumed from Dr. Jacqulyn Bath.  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  Has been feeling unwell for the past 4 days.  He endorses right flank pain, near syncope with fall last night.  He did strike his head.  He has had nausea and dry heaving.  Initial blood pressures on scene were in the 60s SBP.  Blood pressures have normalized, but remains soft.  Labwork shows renal failure.  Awaiting CT scans.  Will require admission. Physical Exam  BP 106/68   Pulse 77   Temp 97.8 F (36.6 C) (Oral)   Resp 13   Ht 5\' 9"  (1.753 m)   Wt 113.4 kg   SpO2 100%   BMI 36.92 kg/m   Physical Exam Vitals and nursing note reviewed.  Constitutional:      General: He is not in acute distress.    Appearance: Normal appearance. He is well-developed. He is not ill-appearing, toxic-appearing or diaphoretic.  HENT:     Head: Normocephalic and atraumatic.     Right Ear: External ear normal.     Left Ear: External ear normal.     Nose: Nose normal.     Mouth/Throat:     Mouth: Mucous membranes are moist.  Eyes:     Extraocular Movements: Extraocular movements intact.     Conjunctiva/sclera: Conjunctivae normal.  Cardiovascular:     Rate and Rhythm: Normal rate and regular rhythm.  Pulmonary:     Effort: Pulmonary effort is normal. No respiratory distress.  Abdominal:     Palpations: Abdomen is soft.     Tenderness: There is abdominal tenderness.  Musculoskeletal:        General: No swelling. Normal range of motion.     Cervical back: Neck supple.  Skin:    General: Skin is warm and dry.     Coloration: Skin is not jaundiced or pale.  Neurological:     General: No focal deficit present.     Mental Status: He is alert and oriented to person, place, and time.  Psychiatric:        Mood and Affect: Mood normal.        Behavior: Behavior normal.        Thought Content: Thought content normal.        Judgment: Judgment normal.     Procedures  Procedures  ED Course / MDM    Medical Decision  Making Amount and/or Complexity of Data Reviewed Labs: ordered. Radiology: ordered.  Risk Prescription drug management.   On assessment, patient resting on ED stretcher.  Endorses ongoing pain in area of right flank/right upper quadrant, as well as ongoing generalized weakness.  He also endorses dysuria starting today.  Urinalysis shows evidence of UTI.  Ceftriaxone was ordered.  Cultures are pending.  CT imaging and right upper quadrant ultrasound showed no acute findings.  Patient was admitted to medicine for further management.       Gloris Manchester, MD 07/16/22 1001

## 2022-07-16 NOTE — ED Provider Notes (Signed)
Emergency Department Provider Note   I have reviewed the triage vital signs and the nursing notes.   HISTORY  Chief Complaint Loss of Consciousness, Emesis (/), Diarrhea, and Head Injury   HPI Joseph Ruiz is a 47 y.o. male with PMH of HTN, COPD, and DM presents to the ED with 5 days of fatigue, near syncope, and right sided abdominal pain. The abdominal pain was present several weeks prior but resolved without intervention. His right abdominal/flank pain has returned and he has developed associated nausea and vomiting with poor PO intake. He denies any CP or fever. Notes some chills and SOB, however. No black or BRB in the BMs. He has felt very lightheaded to the point of causing him to fall last night and strike his head. Denies full syncope with that event. He is not anticoagulated. With continued lightheadedness through the night he called EMS who found him hypotensive on arrival.    Past Medical History:  Diagnosis Date   COPD (chronic obstructive pulmonary disease) (HCC)    DDD (degenerative disc disease), lumbar    Diabetes mellitus without complication (HCC)    DVT (deep venous thrombosis) (HCC)    Hypertension    Kidney stone     Review of Systems  Constitutional: No fever/chills Cardiovascular: Denies chest pain. Positive near syncope.  Respiratory: Positive shortness of breath. Gastrointestinal: Positive right sided abdominal pain. Positive nausea and vomiting.  No diarrhea.  No constipation. Musculoskeletal: Negative for back pain. Skin: Negative for rash. Neurological: Negative for focal weakness or numbness. Positive HA for fall last night with head injury.    ____________________________________________   PHYSICAL EXAM:  VITAL SIGNS: ED Triage Vitals  Enc Vitals Group     BP 07/16/22 0600 (!) 81/51     Pulse Rate 07/16/22 0600 80     Resp 07/16/22 0600 13     Temp 07/16/22 0608 97.8 F (36.6 C)     Temp Source 07/16/22 0608 Oral     SpO2  07/16/22 0600 99 %     Weight 07/16/22 0608 250 lb (113.4 kg)     Height 07/16/22 0608 5\' 9"  (1.753 m)   Constitutional: Alert and oriented. Well appearing and in no acute distress. Eyes: Conjunctivae are normal. Head: Atraumatic. Nose: No congestion/rhinnorhea. Mouth/Throat: Mucous membranes are dry.  Neck: No stridor. Cardiovascular: Normal rate, regular rhythm. Good peripheral circulation. Grossly normal heart sounds.   Respiratory: Normal respiratory effort.  No retractions. Lungs CTAB. Gastrointestinal: Soft and nontender. No distention.  Musculoskeletal: No gross deformities of extremities. Neurologic:  Normal speech and language. No gross focal neurologic deficits are appreciated.  Skin:  Skin is warm, dry and intact. No rash noted.   ____________________________________________   LABS (all labs ordered are listed, but only abnormal results are displayed)  Labs Reviewed  URINE CULTURE - Abnormal; Notable for the following components:      Result Value   Culture >=100,000 COLONIES/mL ESCHERICHIA COLI (*)    Organism ID, Bacteria ESCHERICHIA COLI (*)    All other components within normal limits  COMPREHENSIVE METABOLIC PANEL - Abnormal; Notable for the following components:   CO2 20 (*)    Glucose, Bld 118 (*)    BUN 46 (*)    Creatinine, Ser 4.62 (*)    Total Protein 8.7 (*)    GFR, Estimated 15 (*)    All other components within normal limits  LIPASE, BLOOD - Abnormal; Notable for the following components:   Lipase 54 (*)  All other components within normal limits  CBC WITH DIFFERENTIAL/PLATELET - Abnormal; Notable for the following components:   WBC 15.6 (*)    Neutro Abs 10.5 (*)    Monocytes Absolute 1.2 (*)    All other components within normal limits  URINALYSIS, W/ REFLEX TO CULTURE (INFECTION SUSPECTED) - Abnormal; Notable for the following components:   APPearance CLOUDY (*)    Hgb urine dipstick SMALL (*)    Protein, ur 100 (*)    Leukocytes,Ua LARGE  (*)    Bacteria, UA MANY (*)    All other components within normal limits  CK - Abnormal; Notable for the following components:   Total CK 36 (*)    All other components within normal limits  BASIC METABOLIC PANEL - Abnormal; Notable for the following components:   CO2 18 (*)    Glucose, Bld 119 (*)    BUN 47 (*)    Creatinine, Ser 2.93 (*)    Calcium 8.1 (*)    GFR, Estimated 26 (*)    All other components within normal limits  HEMOGLOBIN A1C - Abnormal; Notable for the following components:   Hgb A1c MFr Bld 6.1 (*)    All other components within normal limits  BASIC METABOLIC PANEL - Abnormal; Notable for the following components:   CO2 18 (*)    BUN 37 (*)    Calcium 8.1 (*)    All other components within normal limits  CBC - Abnormal; Notable for the following components:   RBC 3.75 (*)    Hemoglobin 10.8 (*)    HCT 34.0 (*)    All other components within normal limits  CBC - Abnormal; Notable for the following components:   RBC 3.52 (*)    Hemoglobin 10.2 (*)    HCT 32.1 (*)    All other components within normal limits  COMPREHENSIVE METABOLIC PANEL - Abnormal; Notable for the following components:   Chloride 112 (*)    CO2 21 (*)    BUN 24 (*)    Calcium 8.2 (*)    Total Protein 5.5 (*)    Albumin 2.7 (*)    All other components within normal limits  LACTIC ACID, PLASMA  PROTIME-INR  HIV ANTIBODY (ROUTINE TESTING W REFLEX)  TROPONIN I (HIGH SENSITIVITY)  TROPONIN I (HIGH SENSITIVITY)   ____________________________________________  EKG   EKG Interpretation  Date/Time:  Sunday July 16 2022 06:06:23 EDT Ventricular Rate:  88 PR Interval:  133 QRS Duration: 95 QT Interval:  384 QTC Calculation: 465 R Axis:   78 Text Interpretation: Sinus rhythm Probable left atrial enlargement Confirmed by Alona Bene [phone]) on 07/16/2022 6:11:29 AM        ____________________________________________   PROCEDURES  Procedure(s) performed:    Procedures  CRITICAL CARE Performed by: Maia Plan Total critical care time: 35 minutes Critical care time was exclusive of separately billable procedures and treating other patients. Critical care was necessary to treat or prevent imminent or life-threatening deterioration. Critical care was time spent personally by me on the following activities: development of treatment plan with patient and/or surrogate as well as nursing, discussions with consultants, evaluation of patient's response to treatment, examination of patient, obtaining history from patient or surrogate, ordering and performing treatments and interventions, ordering and review of laboratory studies, ordering and review of radiographic studies, pulse oximetry and re-evaluation of patient's condition.  Alona Bene, MD Emergency Medicine  ____________________________________________   INITIAL IMPRESSION / ASSESSMENT AND PLAN / ED COURSE  Pertinent labs & imaging results that were available during my care of the patient were reviewed by me and considered in my medical decision making (see chart for details).   This patient is Presenting for Evaluation of near syncope, which does require a range of treatment options, and is a complaint that involves a high risk of morbidity and mortality.  The Differential Diagnoses include hypovolemic shock,  hemorrhagic shock, sepsis, dehydration, head injury, ICH, etc.  Critical Interventions-    Medications  aspirin EC tablet 81 mg (81 mg Oral Given 07/18/22 0851)  heparin injection 5,000 Units (5,000 Units Subcutaneous Given 07/18/22 1300)  acetaminophen (TYLENOL) tablet 650 mg (650 mg Oral Given 07/17/22 0545)    Or  acetaminophen (TYLENOL) suppository 650 mg ( Rectal See Alternative 07/17/22 0545)  oxyCODONE (Oxy IR/ROXICODONE) immediate release tablet 5 mg (5 mg Oral Given 07/18/22 1744)  ondansetron (ZOFRAN) tablet 4 mg (4 mg Oral Given 07/18/22 1506)    Or  ondansetron (ZOFRAN)  injection 4 mg ( Intravenous See Alternative 07/18/22 1506)  albuterol (PROVENTIL) (2.5 MG/3ML) 0.083% nebulizer solution 2.5 mg (has no administration in time range)  metoprolol tartrate (LOPRESSOR) injection 5 mg (has no administration in time range)  feeding supplement (ENSURE ENLIVE / ENSURE PLUS) liquid 237 mL (237 mLs Oral Patient Refused/Not Given 07/18/22 1405)  Oral care mouth rinse (has no administration in time range)  nicotine (NICODERM CQ - dosed in mg/24 hours) patch 21 mg (21 mg Transdermal Patch Removed 07/18/22 0852)  gabapentin (NEURONTIN) capsule 400 mg (400 mg Oral Given 07/18/22 1506)  cefadroxil (DURICEF) capsule 1,000 mg (has no administration in time range)  sodium chloride 0.9 % bolus 1,000 mL (0 mLs Intravenous Stopped 07/16/22 0915)  fentaNYL (SUBLIMAZE) injection 50 mcg (50 mcg Intravenous Given 07/16/22 0830)  cefTRIAXone (ROCEPHIN) 2 g in sodium chloride 0.9 % 100 mL IVPB (0 g Intravenous Stopped 07/16/22 1032)  fentaNYL (SUBLIMAZE) injection 12.5 mcg (12.5 mcg Intravenous Given 07/16/22 2153)  traZODone (DESYREL) tablet 25 mg (25 mg Oral Given 07/17/22 2248)  polyethylene glycol (MIRALAX / GLYCOLAX) packet 17 g (17 g Oral Given 07/18/22 1026)    Reassessment after intervention: symptoms improved.    I did obtain Additional Historical Information from EMS.   I decided to review pertinent External Data, and in summary no recent ED visits in our system.   Clinical Laboratory Tests Ordered, included UA pending. No severe anemia. Patient with acute renal failure.   Radiologic Tests Ordered, included CXR. I independently interpreted the images and agree with radiology interpretation.   Cardiac Monitor Tracing which shows NSR.    Social Determinants of Health Risk patient is a smoker.   Medical Decision Making: Summary:  Patient presents emergency department with hypotension, near syncope with fall and head injury last night.  Some intermittent right flank pain as well  along with nausea and vomiting.  Differential at this time is broad.  Patient looks very well.  He is awake, alert, briskly interactive and conversational.  No focal deficits.  Reevaluation with update and discussion with patient. Care transferred to Dr. Edilia Bo pending additional result. Anticipate admission.   Patient's presentation is most consistent with acute presentation with potential threat to life or bodily function.   Disposition: admit  ____________________________________________  FINAL CLINICAL IMPRESSION(S) / ED DIAGNOSES  Final diagnoses:  Renal failure, unspecified chronicity  Urinary tract infection with hematuria, site unspecified     Note:  This document was prepared using Dragon voice recognition software and may  include unintentional dictation errors.  Alona Bene, MD, [facility] Emergency Medicine    Marjarie Irion, Arlyss Repress, MD 07/18/22 [id number]

## 2022-07-16 NOTE — ED Notes (Signed)
Dr. Dixon in with pt.

## 2022-07-16 NOTE — ED Notes (Signed)
ED TO INPATIENT HANDOFF REPORT  ED Nurse Name and Phone #: Thamas Jaegers Name/Age/Gender Joseph Ruiz 47 y.o. male Room/Bed: WA10/WA10  Code Status   Code Status: Full Code  Home/SNF/Other Home Patient oriented to: self, place, time, and situation Is this baseline? Yes   Triage Complete: Triage complete  Chief Complaint AKI (acute kidney injury) (HCC) [N17.9]  Triage Note Pt arrives via EMS for N/V/D since last night with a syncopal episode approx. 2200 where pt reports he does not remember hitting the floor while standing. Pt also reports pain in R frontal, neck, and R side but says the R side pain was present prior to falling last night, and blurred vision. Pt states he has continued to feel as if he will pass out for multiple episodes since syncope last night. GCS 15. + Hematoma to R Frontal. Pt denies thinners but states he does take several BC powders daily. EMS reports initial BP for them 68/46 despite unsuccessful IV attempts pt BP improved on its own to 98/46.    Allergies No Known Allergies  Level of Care/Admitting Diagnosis ED Disposition     ED Disposition  Admit   Condition  --   Comment  Hospital Area: [facility] [[id number]]  Level of Care: Progressive [102]  Admit to Progressive based on following criteria: CARDIOVASCULAR & THORACIC of moderate stability with acute coronary syndrome symptoms/low risk myocardial infarction/hypertensive urgency/arrhythmias/heart failure potentially compromising stability and stable post cardiovascular intervention patients.  May place patient in observation at [facility] or [facility] if equivalent level of care is available:: Yes  Covid Evaluation: Asymptomatic - no recent exposure (last 10 days) testing not required  Diagnosis: AKI (acute kidney injury) [facility]) [[id number]]  Admitting Physician: Maryln Gottron [[id number]]  Attending Physician: Kirby Crigler, MIR M [[id number]]          B Medical/Surgery  History Past Medical History:  Diagnosis Date   COPD (chronic obstructive pulmonary disease) (HCC)    DDD (degenerative disc disease), lumbar    Diabetes mellitus without complication (HCC)    DVT (deep venous thrombosis) (HCC)    Hypertension    Kidney stone    Past Surgical History:  Procedure Laterality Date   BACK SURGERY     CHOLECYSTECTOMY     GALLBLADDER SURGERY     KIDNEY SURGERY  2010     A IV Location/Drains/Wounds Patient Lines/Drains/Airways Status     Active Line/Drains/Airways     Name Placement date Placement time Site Days   Peripheral IV 07/16/22 20 G 1" Left Antecubital 07/16/22  0637  Antecubital  less than 1            Intake/Output Last 24 hours  Intake/Output Summary (Last 24 hours) at 07/16/2022 1109 Last data filed at 07/16/2022 1032 Gross per 24 hour  Intake 1100 ml  Output --  Net 1100 ml    Labs/Imaging Results for orders placed or performed during the hospital encounter of 07/16/22 (from the past 48 hour(s))  Comprehensive metabolic panel     Status: Abnormal   Collection Time: 07/16/22  6:39 AM  Result Value Ref Range   Sodium 135 135 - 145 mmol/L   Potassium 3.5 3.5 - 5.1 mmol/L   Chloride 100 98 - 111 mmol/L   CO2 20 (L) 22 - 32 mmol/L   Glucose, Bld 118 (H) 70 - 99 mg/dL    Comment: Glucose reference range applies only to samples taken after fasting for at least 8  hours.   BUN 46 (H) 6 - 20 mg/dL   Creatinine, Ser 1.61 (H) 0.61 - 1.24 mg/dL   Calcium 9.0 8.9 - 09.6 mg/dL   Total Protein 8.7 (H) 6.5 - 8.1 g/dL   Albumin 4.2 3.5 - 5.0 g/dL   AST 25 15 - 41 U/L   ALT 25 0 - 44 U/L   Alkaline Phosphatase 90 38 - 126 U/L   Total Bilirubin 0.3 0.3 - 1.2 mg/dL   GFR, Estimated 15 (L) >60 mL/min    Comment: (NOTE) Calculated using the CKD-EPI Creatinine Equation (2021)    Anion gap 15 5 - 15    Comment: Performed at [facility], [address], [place], [place] [address]  Lactic acid, plasma     Status:  None   Collection Time: 07/16/22  6:39 AM  Result Value Ref Range   Lactic Acid, Venous 1.2 0.5 - 1.9 mmol/L    Comment: Performed at [facility], [address] [place], [place] [address]  Lipase, blood     Status: Abnormal   Collection Time: 07/16/22  6:39 AM  Result Value Ref Range   Lipase 54 (H) 11 - 51 U/L    Comment: Performed at [facility], [address], [place], [place] [address]  Troponin I (High Sensitivity)     Status: None   Collection Time: 07/16/22  6:39 AM  Result Value Ref Range   Troponin I (High Sensitivity) 10 <18 ng/L    Comment: (NOTE) Elevated high sensitivity troponin I (hsTnI) values and significant  changes across serial measurements may suggest ACS but many other  chronic and acute conditions are known to elevate hsTnI results.  Refer to the "Links" section for chest pain algorithms and additional  guidance. Performed at [facility], [address], [place], [place] [address]   CBC with Differential     Status: Abnormal   Collection Time: 07/16/22  6:39 AM  Result Value Ref Range   WBC 15.6 (H) 4.0 - 10.5 K/uL   RBC 4.84 4.22 - 5.81 MIL/uL   Hemoglobin 13.8 13.0 - 17.0 g/dL   HCT 30.8 65.7 - 84.6 %   MCV 88.0 80.0 - 100.0 fL   MCH 28.5 26.0 - 34.0 pg   MCHC 32.4 30.0 - 36.0 g/dL   RDW 96.2 95.2 - 84.1 %   Platelets 365 150 - 400 K/uL   nRBC 0.0 0.0 - 0.2 %   Neutrophils Relative % 68 %   Neutro Abs 10.5 (H) 1.7 - 7.7 K/uL   Lymphocytes Relative 23 %   Lymphs Abs 3.6 0.7 - 4.0 K/uL   Monocytes Relative 7 %   Monocytes Absolute 1.2 (H) 0.1 - 1.0 K/uL   Eosinophils Relative 1 %   Eosinophils Absolute 0.2 0.0 - 0.5 K/uL   Basophils Relative 1 %   Basophils Absolute 0.1 0.0 - 0.1 K/uL   Immature Granulocytes 0 %   Abs Immature Granulocytes 0.06 0.00 - 0.07 K/uL    Comment: Performed at [facility], [address] [place], [place] [address]  Protime-INR      Status: None   Collection Time: 07/16/22  6:39 AM  Result Value Ref Range   Prothrombin Time 13.1 11.4 - 15.2 seconds   INR 1.0 0.8 - 1.2    Comment: (NOTE) INR goal varies based on device and disease states. Performed at [facility], [address] [place],  [place] [address]   Urinalysis, w/ Reflex to Culture (Infection Suspected) -Urine, Clean Catch     Status: Abnormal   Collection Time: 07/16/22  8:20 AM  Result Value Ref Range   Specimen Source URINE, CLEAN CATCH    Color, Urine YELLOW YELLOW   APPearance CLOUDY (A) CLEAR   Specific Gravity, Urine 1.024 1.005 - 1.030   pH 5.0 5.0 - 8.0   Glucose, UA NEGATIVE NEGATIVE mg/dL   Hgb urine dipstick SMALL (A) NEGATIVE   Bilirubin Urine NEGATIVE NEGATIVE   Ketones, ur NEGATIVE NEGATIVE mg/dL   Protein, ur 782 (A) NEGATIVE mg/dL   Nitrite NEGATIVE NEGATIVE   Leukocytes,Ua LARGE (A) NEGATIVE   RBC / HPF 21-50 0 - 5 RBC/hpf   WBC, UA >50 0 - 5 WBC/hpf    Comment:        Reflex urine culture not performed if WBC <=10, OR if Squamous epithelial cells >5. If Squamous epithelial cells >5 suggest recollection.    Bacteria, UA MANY (A) NONE SEEN   Squamous Epithelial / HPF 6-10 0 - 5 /HPF   Mucus PRESENT    Hyaline Casts, UA PRESENT     Comment: Performed at [facility], [address] [place], [place] [address]  Troponin I (High Sensitivity)     Status: None   Collection Time: 07/16/22  8:27 AM  Result Value Ref Range   Troponin I (High Sensitivity) 8 <18 ng/L    Comment: (NOTE) Elevated high sensitivity troponin I (hsTnI) values and significant  changes across serial measurements may suggest ACS but many other  chronic and acute conditions are known to elevate hsTnI results.  Refer to the "Links" section for chest pain algorithms and additional  guidance. Performed at [facility], [address], [place], [place] [address]   CK     Status: Abnormal   Collection  Time: 07/16/22  8:27 AM  Result Value Ref Range   Total CK 36 (L) 49 - 397 U/L    Comment: Performed at [facility], [address] [place], [place] [address]   CT ABDOMEN PELVIS WO CONTRAST  Result Date: 07/16/2022 CLINICAL DATA:  Abdominal and flank pain, right-sided flank pain for 4 days, history of stones EXAM: CT ABDOMEN AND PELVIS WITHOUT CONTRAST TECHNIQUE: Multidetector CT imaging of the abdomen and pelvis was performed following the standard protocol without IV contrast. RADIATION DOSE REDUCTION: This exam was performed according to the departmental dose-optimization program which includes automated exposure control, adjustment of the mA and/or kV according to patient size and/or use of iterative reconstruction technique. COMPARISON:  04/02/2022 FINDINGS: Lower chest: No acute abnormality. Hepatobiliary: No focal liver abnormality is seen. Status post cholecystectomy. No biliary dilatation. Pancreas: Unremarkable. No pancreatic ductal dilatation or surrounding inflammatory changes. Spleen: Normal in size without significant abnormality. Adrenals/Urinary Tract: Adrenal glands are unremarkable. Kidneys are normal, without renal calculi, solid lesion, or hydronephrosis. Bladder is unremarkable. Stomach/Bowel: Stomach is within normal limits. Appendix appears normal. No evidence of bowel wall thickening, distention, or inflammatory changes. Vascular/Lymphatic: Aortic atherosclerosis. No enlarged abdominal or pelvic lymph nodes. Reproductive: No mass or other significant abnormality. Other: Small, fat containing left inguinal hernia.  No ascites. Musculoskeletal: No acute or significant osseous findings. IMPRESSION: 1. No acute noncontrast CT findings of the abdomen or pelvis to explain right-sided flank pain. No urinary tract calculi or hydronephrosis. 2. Status post cholecystectomy. 3. Small, fat containing left inguinal hernia. Aortic Atherosclerosis (ICD10-I70.0). Electronically  Signed  By: Jearld Lesch M.D.   On: 07/16/2022 09:34   CT Head Wo Contrast  Result Date: 07/16/2022 CLINICAL DATA:  Moderate to severe head trauma EXAM: CT HEAD WITHOUT CONTRAST CT CERVICAL SPINE WITHOUT CONTRAST TECHNIQUE: Multidetector CT imaging of the head and cervical spine was performed following the standard protocol without intravenous contrast. Multiplanar CT image reconstructions of the cervical spine were also generated. RADIATION DOSE REDUCTION: This exam was performed according to the departmental dose-optimization program which includes automated exposure control, adjustment of the mA and/or kV according to patient size and/or use of iterative reconstruction technique. COMPARISON:  11/05/2019 FINDINGS: CT HEAD FINDINGS Brain: No evidence of acute infarction, hemorrhage, hydrocephalus, extra-axial collection or mass lesion/mass effect. Vascular: No hyperdense vessel or unexpected calcification. Skull: Normal. Negative for fracture or focal lesion. Sinuses/Orbits: No acute finding. CT CERVICAL SPINE FINDINGS Alignment: Normal. Skull base and vertebrae: No acute fracture. No primary bone lesion or focal pathologic process. Soft tissues and spinal canal: No prevertebral fluid or swelling. No visible canal hematoma. Disc levels: Disc space narrowing with mild ridging. Early posterior longitudinal ligament ossification. Upper chest: Negative IMPRESSION: No evidence of acute intracranial or cervical spine injury. Electronically Signed   By: Tiburcio Pea M.D.   On: 07/16/2022 09:28   CT Cervical Spine Wo Contrast  Result Date: 07/16/2022 CLINICAL DATA:  Moderate to severe head trauma EXAM: CT HEAD WITHOUT CONTRAST CT CERVICAL SPINE WITHOUT CONTRAST TECHNIQUE: Multidetector CT imaging of the head and cervical spine was performed following the standard protocol without intravenous contrast. Multiplanar CT image reconstructions of the cervical spine were also generated. RADIATION DOSE REDUCTION: This  exam was performed according to the departmental dose-optimization program which includes automated exposure control, adjustment of the mA and/or kV according to patient size and/or use of iterative reconstruction technique. COMPARISON:  11/05/2019 FINDINGS: CT HEAD FINDINGS Brain: No evidence of acute infarction, hemorrhage, hydrocephalus, extra-axial collection or mass lesion/mass effect. Vascular: No hyperdense vessel or unexpected calcification. Skull: Normal. Negative for fracture or focal lesion. Sinuses/Orbits: No acute finding. CT CERVICAL SPINE FINDINGS Alignment: Normal. Skull base and vertebrae: No acute fracture. No primary bone lesion or focal pathologic process. Soft tissues and spinal canal: No prevertebral fluid or swelling. No visible canal hematoma. Disc levels: Disc space narrowing with mild ridging. Early posterior longitudinal ligament ossification. Upper chest: Negative IMPRESSION: No evidence of acute intracranial or cervical spine injury. Electronically Signed   By: Tiburcio Pea M.D.   On: 07/16/2022 09:28   US Abdomen Limited  Result Date: 07/16/2022 CLINICAL DATA:  Right upper quadrant pain for 1 week, history of cholecystectomy EXAM: ULTRASOUND ABDOMEN LIMITED RIGHT UPPER QUADRANT COMPARISON:  None Available. FINDINGS: Gallbladder: Status post cholecystectomy.  No sonographic Murphy sign. Common bile duct: Diameter: 0.4 cm Liver: No focal lesion identified. Slightly increased parenchymal echogenicity. Portal vein is patent on color Doppler imaging with normal direction of blood flow towards the liver. Other: None. IMPRESSION: 1. Status post cholecystectomy. No biliary ductal dilation. 2. Hepatic steatosis. Electronically Signed   By: Jearld Lesch M.D.   On: 07/16/2022 09:24   DG Chest Portable 1 View  Result Date: 07/16/2022 CLINICAL DATA:  Shortness of breath and syncope. EXAM: PORTABLE CHEST 1 VIEW COMPARISON:  05/07/2022 FINDINGS: The lungs are clear without focal pneumonia,  edema, pneumothorax or pleural effusion. The cardiopericardial silhouette is within normal limits for size. No acute bony abnormality. Telemetry leads overlie the chest. IMPRESSION: No active disease. Electronically Signed   By: Kennith Center  M.D.   On: 07/16/2022 07:05    Pending Labs Unresulted Labs (From admission, onward)     Start     Ordered   07/17/22 0500  Basic metabolic panel  Tomorrow morning,   R        07/16/22 1032   07/17/22 0500  CBC  Tomorrow morning,   R        07/16/22 1032   07/16/22 1300  Basic metabolic panel  Once,   STAT        07/16/22 1022   07/16/22 1055  Hemoglobin A1c  Once,   R        07/16/22 1054   07/16/22 1031  HIV Antibody (routine testing w rflx)  (HIV Antibody (Routine testing w reflex) panel)  Once,   R        07/16/22 1032   07/16/22 0615  Urine Culture  Once,   URGENT       Question:  Indication  Answer:  Dysuria   07/16/22 0618            Vitals/Pain Today's Vitals   07/16/22 0856 07/16/22 0938 07/16/22 1032 07/16/22 1100  BP:    99/73  Pulse:    72  Resp:    14  Temp:      TempSrc:      SpO2:    100%  Weight:      Height:      PainSc: 6  2  6       Isolation Precautions No active isolations  Medications Medications  0.9 %  sodium chloride infusion ( Intravenous New Bag/Given 07/16/22 1031)  aspirin EC tablet 81 mg (has no administration in time range)  sertraline (ZOLOFT) tablet 50 mg (has no administration in time range)  OLANZapine (ZYPREXA) tablet 5 mg (has no administration in time range)  gabapentin (NEURONTIN) capsule 300 mg (has no administration in time range)  fluticasone (FLONASE) 50 MCG/ACT nasal spray 2 spray (has no administration in time range)  heparin injection 5,000 Units (has no administration in time range)  acetaminophen (TYLENOL) tablet 650 mg (has no administration in time range)    Or  acetaminophen (TYLENOL) suppository 650 mg (has no administration in time range)  oxyCODONE (Oxy IR/ROXICODONE)  immediate release tablet 5 mg (has no administration in time range)  ondansetron (ZOFRAN) tablet 4 mg (has no administration in time range)    Or  ondansetron (ZOFRAN) injection 4 mg (has no administration in time range)  albuterol (PROVENTIL) (2.5 MG/3ML) 0.083% nebulizer solution 2.5 mg (has no administration in time range)  cefTRIAXone (ROCEPHIN) 2 g in sodium chloride 0.9 % 100 mL IVPB (has no administration in time range)  metoprolol tartrate (LOPRESSOR) injection 5 mg (has no administration in time range)  sodium chloride 0.9 % bolus 1,000 mL (0 mLs Intravenous Stopped 07/16/22 0915)  fentaNYL (SUBLIMAZE) injection 50 mcg (50 mcg Intravenous Given 07/16/22 0830)  cefTRIAXone (ROCEPHIN) 2 g in sodium chloride 0.9 % 100 mL IVPB (0 g Intravenous Stopped 07/16/22 1032)    Mobility walks     Focused Assessments     R Recommendations: See Admitting Provider Note  Report given to:   Additional Notes:

## 2022-07-16 NOTE — H&P (Addendum)
History and Physical  Joseph Ruiz [id number]:[id number] DOB: 15-Dec-1975 DOA: 07/16/2022  PCP: Shelle Iron, MD   Chief Complaint: Syncope  HPI: Joseph Ruiz is a 47 y.o. male with medical history significant for hypertension, chronic pain from degenerative disc disease, COPD on room air being admitted to the hospital with dehydration and acute renal failure.  Patient states he has not been feeling well for the last 4 to 5 days, started having epigastric abdominal pain, and right-sided flank pain.  He had some associated chills, watery diarrhea x 4 which started yesterday, along with some dysuria.  Has been nauseous as well the last few days, but not vomiting.  Says he has not been eating or drinking hardly anything since he is feeling unwell, has not been urinating much, urine is dark yellow.  No sick contacts, did not eat anything questionable.  He takes lisinopril hydrochlorothiazide for his hypertension, was taking this medication until yesterday morning.  The last couple of days, he has been getting lightheaded especially when he tries to sit up, last evening he was very close to passing out, did not completely lose consciousness but he did fall and hit his head.  At that point, he called EMS.  ED Course: EMS reports initial blood pressure 68/46, initial blood pressure in the ER 81/51, was given IV fluid bolus blood pressure improved 112/60.  He has been afebrile here, saturating well on room air.  Lab work was done shows WBC 15, but otherwise normal CBC, lactate 1.2, troponin negative, CK 36, electrolytes unremarkable, creatinine 4.62 from normal baseline.  Normal LFTs.  He was given IV fluids, he was given empiric IV Rocephin due to abnormal urinalysis and complaints of dysuria, and hospitalist was contacted for admission.  Review of Systems: Please see HPI for pertinent positives and negatives. A complete 10 system review of systems are otherwise negative.  Past Medical History:   Diagnosis Date   COPD (chronic obstructive pulmonary disease) (HCC)    DDD (degenerative disc disease), lumbar    Diabetes mellitus without complication (HCC)    DVT (deep venous thrombosis) (HCC)    Hypertension    Kidney stone    Past Surgical History:  Procedure Laterality Date   BACK SURGERY     CHOLECYSTECTOMY     GALLBLADDER SURGERY     KIDNEY SURGERY  2010    Social History:  reports that he has been smoking cigarettes. He has been smoking an average of .5 packs per day. He has never used smokeless tobacco. He reports that he does not drink alcohol and does not use drugs.   No Known Allergies  Family History  Problem Relation Age of Onset   Hypertension Father    COPD Father    Diabetes Mother    Hypertension Mother    Hypertension Sister      Prior to Admission medications   Medication Sig Start Date End Date Taking? Authorizing Provider  albuterol (VENTOLIN HFA) 108 (90 Base) MCG/ACT inhaler Inhale 2 puffs into the lungs every 4 (four) hours as needed for wheezing or shortness of breath.    [provider]  amLODipine (NORVASC) 5 MG tablet Take 5 mg by mouth daily. 04/10/22   [provider]  aspirin EC 81 MG tablet Take 1 tablet (81 mg total) by mouth daily. 05/10/17   Georgeanna Lea, MD  atenolol (TENORMIN) 25 MG tablet Take 25 mg by mouth daily. 05/12/22   [provider]  atenolol (TENORMIN) 50  MG tablet Take 50 mg by mouth daily.  09/01/13   [provider]  fluticasone (FLONASE) 50 MCG/ACT nasal spray Place 2 sprays into both nostrils daily.    [provider]  gabapentin (NEURONTIN) 300 MG capsule Take 300 mg by mouth 3 (three) times daily. 06/12/22   [provider]  lisinopril-hydrochlorothiazide (ZESTORETIC) 20-25 MG tablet Take 1 tablet by mouth daily.    [provider]  mupirocin ointment (BACTROBAN) 2 % Apply 1 application topically 3 (three) times daily.     [provider]   naloxone [facility]) nasal spray 4 mg/0.1 mL Place 0.4 mg into the nose once. Spray 1 spray into each nostril single dose as needed may repeat dose in alternate nostrils every 2-3 minutes until responsive or EMS arrives 03/24/22   [provider]  OLANZapine (ZYPREXA) 5 MG tablet Take 5 mg by mouth at bedtime. 04/14/22   [provider]  oxyCODONE-acetaminophen (PERCOCET/ROXICET) 5-325 MG tablet Take 1 tablet by mouth every 6 (six) hours as needed for severe pain. 11/05/19   Horton, Mayer Masker, MD  pregabalin (LYRICA) 150 MG capsule Take 150 mg by mouth 2 (two) times daily.    [provider]  sertraline (ZOLOFT) 50 MG tablet Take 50 mg by mouth daily. 04/14/22   [provider]  telmisartan (MICARDIS) 20 MG tablet TAKE ONE (1) TABLET ONCE DAILY 07/09/17   Georgeanna Lea, MD    Physical Exam: BP 112/60 (BP Location: Left Arm)   Pulse 72   Temp (!) 97.5 F (36.4 C) (Oral)   Resp 14   Ht 5\' 9"  (1.753 m)   Wt 113.4 kg   SpO2 100%   BMI 36.92 kg/m   General:  Alert, oriented, calm, in no acute distress, looks dry on exam Eyes: EOMI, clear conjuctivae, white sclerea Neck: supple, no masses, trachea mildline  Cardiovascular: RRR, no murmurs or rubs, no peripheral edema  Respiratory: clear to auscultation bilaterally, no wheezes, no crackles  Abdomen: soft, nontender, nondistended, normal bowel tones heard  Skin: dry, no rashes  Musculoskeletal: no joint effusions, normal range of motion  Psychiatric: appropriate affect, normal speech  Neurologic: extraocular muscles intact, clear speech, moving all extremities with intact sensorium          Labs on Admission:  Basic Metabolic Panel: Recent Labs  Lab 07/16/22 0639  NA 135  K 3.5  CL 100  CO2 20*  GLUCOSE 118*  BUN 46*  CREATININE 4.62*  CALCIUM 9.0   Liver Function Tests: Recent Labs  Lab 07/16/22 0639  AST 25  ALT 25  ALKPHOS 90  BILITOT 0.3  PROT 8.7*  ALBUMIN 4.2   Recent Labs   Lab 07/16/22 0639  LIPASE 54*   No results for input(s): "AMMONIA" in the last 168 hours. CBC: Recent Labs  Lab 07/16/22 0639  WBC 15.6*  NEUTROABS 10.5*  HGB 13.8  HCT 42.6  MCV 88.0  PLT 365   Cardiac Enzymes: Recent Labs  Lab 07/16/22 0827  CKTOTAL 36*    BNP (last 3 results) No results for input(s): "BNP" in the last 8760 hours.  ProBNP (last 3 results) No results for input(s): "PROBNP" in the last 8760 hours.  CBG: No results for input(s): "GLUCAP" in the last 168 hours.  Radiological Exams on Admission: CT ABDOMEN PELVIS WO CONTRAST  Result Date: 07/16/2022 CLINICAL DATA:  Abdominal and flank pain, right-sided flank pain for 4 days, history of stones EXAM: CT ABDOMEN AND PELVIS WITHOUT  CONTRAST TECHNIQUE: Multidetector CT imaging of the abdomen and pelvis was performed following the standard protocol without IV contrast. RADIATION DOSE REDUCTION: This exam was performed according to the departmental dose-optimization program which includes automated exposure control, adjustment of the mA and/or kV according to patient size and/or use of iterative reconstruction technique. COMPARISON:  04/02/2022 FINDINGS: Lower chest: No acute abnormality. Hepatobiliary: No focal liver abnormality is seen. Status post cholecystectomy. No biliary dilatation. Pancreas: Unremarkable. No pancreatic ductal dilatation or surrounding inflammatory changes. Spleen: Normal in size without significant abnormality. Adrenals/Urinary Tract: Adrenal glands are unremarkable. Kidneys are normal, without renal calculi, solid lesion, or hydronephrosis. Bladder is unremarkable. Stomach/Bowel: Stomach is within normal limits. Appendix appears normal. No evidence of bowel wall thickening, distention, or inflammatory changes. Vascular/Lymphatic: Aortic atherosclerosis. No enlarged abdominal or pelvic lymph nodes. Reproductive: No mass or other significant abnormality. Other: Small, fat containing left inguinal  hernia.  No ascites. Musculoskeletal: No acute or significant osseous findings. IMPRESSION: 1. No acute noncontrast CT findings of the abdomen or pelvis to explain right-sided flank pain. No urinary tract calculi or hydronephrosis. 2. Status post cholecystectomy. 3. Small, fat containing left inguinal hernia. Aortic Atherosclerosis (ICD10-I70.0). Electronically Signed   By: Jearld Lesch M.D.   On: 07/16/2022 09:34   CT Head Wo Contrast  Result Date: 07/16/2022 CLINICAL DATA:  Moderate to severe head trauma EXAM: CT HEAD WITHOUT CONTRAST CT CERVICAL SPINE WITHOUT CONTRAST TECHNIQUE: Multidetector CT imaging of the head and cervical spine was performed following the standard protocol without intravenous contrast. Multiplanar CT image reconstructions of the cervical spine were also generated. RADIATION DOSE REDUCTION: This exam was performed according to the departmental dose-optimization program which includes automated exposure control, adjustment of the mA and/or kV according to patient size and/or use of iterative reconstruction technique. COMPARISON:  11/05/2019 FINDINGS: CT HEAD FINDINGS Brain: No evidence of acute infarction, hemorrhage, hydrocephalus, extra-axial collection or mass lesion/mass effect. Vascular: No hyperdense vessel or unexpected calcification. Skull: Normal. Negative for fracture or focal lesion. Sinuses/Orbits: No acute finding. CT CERVICAL SPINE FINDINGS Alignment: Normal. Skull base and vertebrae: No acute fracture. No primary bone lesion or focal pathologic process. Soft tissues and spinal canal: No prevertebral fluid or swelling. No visible canal hematoma. Disc levels: Disc space narrowing with mild ridging. Early posterior longitudinal ligament ossification. Upper chest: Negative IMPRESSION: No evidence of acute intracranial or cervical spine injury. Electronically Signed   By: Tiburcio Pea M.D.   On: 07/16/2022 09:28   CT Cervical Spine Wo Contrast  Result Date:  07/16/2022 CLINICAL DATA:  Moderate to severe head trauma EXAM: CT HEAD WITHOUT CONTRAST CT CERVICAL SPINE WITHOUT CONTRAST TECHNIQUE: Multidetector CT imaging of the head and cervical spine was performed following the standard protocol without intravenous contrast. Multiplanar CT image reconstructions of the cervical spine were also generated. RADIATION DOSE REDUCTION: This exam was performed according to the departmental dose-optimization program which includes automated exposure control, adjustment of the mA and/or kV according to patient size and/or use of iterative reconstruction technique. COMPARISON:  11/05/2019 FINDINGS: CT HEAD FINDINGS Brain: No evidence of acute infarction, hemorrhage, hydrocephalus, extra-axial collection or mass lesion/mass effect. Vascular: No hyperdense vessel or unexpected calcification. Skull: Normal. Negative for fracture or focal lesion. Sinuses/Orbits: No acute finding. CT CERVICAL SPINE FINDINGS Alignment: Normal. Skull base and vertebrae: No acute fracture. No primary bone lesion or focal pathologic process. Soft tissues and spinal canal: No prevertebral fluid or swelling. No visible canal hematoma. Disc levels: Disc space narrowing with mild ridging.  Early posterior longitudinal ligament ossification. Upper chest: Negative IMPRESSION: No evidence of acute intracranial or cervical spine injury. Electronically Signed   By: Tiburcio Pea M.D.   On: 07/16/2022 09:28   US Abdomen Limited  Result Date: 07/16/2022 CLINICAL DATA:  Right upper quadrant pain for 1 week, history of cholecystectomy EXAM: ULTRASOUND ABDOMEN LIMITED RIGHT UPPER QUADRANT COMPARISON:  None Available. FINDINGS: Gallbladder: Status post cholecystectomy.  No sonographic Murphy sign. Common bile duct: Diameter: 0.4 cm Liver: No focal lesion identified. Slightly increased parenchymal echogenicity. Portal vein is patent on color Doppler imaging with normal direction of blood flow towards the liver. Other:  None. IMPRESSION: 1. Status post cholecystectomy. No biliary ductal dilation. 2. Hepatic steatosis. Electronically Signed   By: Jearld Lesch M.D.   On: 07/16/2022 09:24   DG Chest Portable 1 View  Result Date: 07/16/2022 CLINICAL DATA:  Shortness of breath and syncope. EXAM: PORTABLE CHEST 1 VIEW COMPARISON:  05/07/2022 FINDINGS: The lungs are clear without focal pneumonia, edema, pneumothorax or pleural effusion. The cardiopericardial silhouette is within normal limits for size. No acute bony abnormality. Telemetry leads overlie the chest. IMPRESSION: No active disease. Electronically Signed   By: Kennith Center M.D.   On: 07/16/2022 07:05    Assessment/Plan This is a 47 year old gentleman with a history of hypertension, degenerative disc disease, COPD on room air being admitted to the hospital with 5 days of abdominal/flank discomfort, nausea, diarrhea resulting in dehydration and acute renal failure.  Unclear etiology of his abdominal discomfort and especially his right flank pain, note negative abdominal imaging, no evidence of intra-abdominal inflammation, pyelonephritis, cystitis, hydronephrosis, etc.  However he is clinically dehydrated and describes orthostasis due to intolerance for p.o. intake, and diarrhea. -Observation admission -IV fluids -Regular diet as tolerated  Acute renal failure-in the setting of dehydration, and continued lisinopril/hydrochlorothiazide, exacerbated by hypotension due to the same.  Baseline renal function is normal.  Anticipate improvement with hydration. -Avoid nephrotoxins, specifically hold lisinopril/hydrochlorothiazide -IV fluids -Avoid hypotension -Recheck BMP later this afternoon  Hypertension-currently patient is hypotensive, hold antihypertensives, IV metoprolol 5 mg every 6 hours as needed SBP over 160  Suspected UTI due to dysuria as well as leukocytosis, will treat empirically with IV Rocephin, follow urine culture.  Unsure why he would develop a  UTI, he does have a prior history of DM currently not on medication.  Fasting glucose here is acceptable.  Will check hemoglobin A1c.  Degenerative disc disease with peripheral neuropathy-continue home Neurontin  Psych-continue home Zoloft and olanzapine  ADDENDUM 4:30PM - Cr improving down to 2.93.  DVT prophylaxis: Lovenox     Code Status: Full Code  Consults called: None  Admission status: Observation  Time spent: 55 minutes  Thurma Priego Sharlette Dense MD [facility] Pager [phone]  If 7PM-7AM, please contact night-coverage [email or web address] Password [facility]  07/16/2022, 10:32 AM

## 2022-07-17 DIAGNOSIS — Z87442 Personal history of urinary calculi: Secondary | ICD-10-CM | POA: Diagnosis not present

## 2022-07-17 DIAGNOSIS — Z91199 Patient's noncompliance with other medical treatment and regimen due to unspecified reason: Secondary | ICD-10-CM | POA: Diagnosis not present

## 2022-07-17 DIAGNOSIS — I959 Hypotension, unspecified: Secondary | ICD-10-CM | POA: Diagnosis present

## 2022-07-17 DIAGNOSIS — J449 Chronic obstructive pulmonary disease, unspecified: Secondary | ICD-10-CM | POA: Diagnosis present

## 2022-07-17 DIAGNOSIS — I10 Essential (primary) hypertension: Secondary | ICD-10-CM | POA: Diagnosis present

## 2022-07-17 DIAGNOSIS — E669 Obesity, unspecified: Secondary | ICD-10-CM | POA: Diagnosis present

## 2022-07-17 DIAGNOSIS — N3000 Acute cystitis without hematuria: Secondary | ICD-10-CM | POA: Diagnosis not present

## 2022-07-17 DIAGNOSIS — S069X9A Unspecified intracranial injury with loss of consciousness of unspecified duration, initial encounter: Secondary | ICD-10-CM | POA: Diagnosis present

## 2022-07-17 DIAGNOSIS — F17210 Nicotine dependence, cigarettes, uncomplicated: Secondary | ICD-10-CM | POA: Diagnosis present

## 2022-07-17 DIAGNOSIS — Z825 Family history of asthma and other chronic lower respiratory diseases: Secondary | ICD-10-CM | POA: Diagnosis not present

## 2022-07-17 DIAGNOSIS — R319 Hematuria, unspecified: Secondary | ICD-10-CM | POA: Diagnosis not present

## 2022-07-17 DIAGNOSIS — B9620 Unspecified Escherichia coli [E. coli] as the cause of diseases classified elsewhere: Secondary | ICD-10-CM | POA: Diagnosis present

## 2022-07-17 DIAGNOSIS — Z751 Person awaiting admission to adequate facility elsewhere: Secondary | ICD-10-CM | POA: Diagnosis not present

## 2022-07-17 DIAGNOSIS — Z9049 Acquired absence of other specified parts of digestive tract: Secondary | ICD-10-CM | POA: Diagnosis not present

## 2022-07-17 DIAGNOSIS — N179 Acute kidney failure, unspecified: Secondary | ICD-10-CM | POA: Diagnosis present

## 2022-07-17 DIAGNOSIS — W19XXXA Unspecified fall, initial encounter: Secondary | ICD-10-CM | POA: Diagnosis present

## 2022-07-17 DIAGNOSIS — E1140 Type 2 diabetes mellitus with diabetic neuropathy, unspecified: Secondary | ICD-10-CM | POA: Diagnosis present

## 2022-07-17 DIAGNOSIS — Z79899 Other long term (current) drug therapy: Secondary | ICD-10-CM | POA: Diagnosis not present

## 2022-07-17 DIAGNOSIS — R197 Diarrhea, unspecified: Secondary | ICD-10-CM | POA: Diagnosis present

## 2022-07-17 DIAGNOSIS — D649 Anemia, unspecified: Secondary | ICD-10-CM | POA: Diagnosis present

## 2022-07-17 DIAGNOSIS — N390 Urinary tract infection, site not specified: Secondary | ICD-10-CM | POA: Diagnosis present

## 2022-07-17 DIAGNOSIS — M5136 Other intervertebral disc degeneration, lumbar region: Secondary | ICD-10-CM | POA: Diagnosis present

## 2022-07-17 DIAGNOSIS — E8721 Acute metabolic acidosis: Secondary | ICD-10-CM | POA: Diagnosis present

## 2022-07-17 DIAGNOSIS — Z8249 Family history of ischemic heart disease and other diseases of the circulatory system: Secondary | ICD-10-CM | POA: Diagnosis not present

## 2022-07-17 DIAGNOSIS — R55 Syncope and collapse: Secondary | ICD-10-CM | POA: Diagnosis present

## 2022-07-17 DIAGNOSIS — E860 Dehydration: Secondary | ICD-10-CM | POA: Diagnosis present

## 2022-07-17 DIAGNOSIS — Z6836 Body mass index (BMI) 36.0-36.9, adult: Secondary | ICD-10-CM | POA: Diagnosis not present

## 2022-07-17 LAB — BASIC METABOLIC PANEL
Anion gap: 10 (ref 5–15)
BUN: 37 mg/dL — ABNORMAL HIGH (ref 6–20)
CO2: 18 mmol/L — ABNORMAL LOW (ref 22–32)
Calcium: 8.1 mg/dL — ABNORMAL LOW (ref 8.9–10.3)
Chloride: 111 mmol/L (ref 98–111)
Creatinine, Ser: 1 mg/dL (ref 0.61–1.24)
GFR, Estimated: >60 mL/min (ref >60)
Glucose, Bld: 93 mg/dL (ref 70–99)
Potassium: 4.2 mmol/L (ref 3.5–5.1)
Sodium: 139 mmol/L (ref 135–145)

## 2022-07-17 LAB — CBC
HCT: 34 % — ABNORMAL LOW (ref 39.0–52.0)
Hemoglobin: 10.8 g/dL — ABNORMAL LOW (ref 13.0–17.0)
MCH: 28.8 pg (ref 26.0–34.0)
MCHC: 31.8 g/dL (ref 30.0–36.0)
MCV: 90.7 fL (ref 80.0–100.0)
Platelets: 260 10*3/uL (ref 150–400)
RBC: 3.75 MIL/uL — ABNORMAL LOW (ref 4.22–5.81)
RDW: 14.5 % (ref 11.5–15.5)
WBC: 8.2 10*3/uL (ref 4.0–10.5)
nRBC: 0 % (ref 0.0–0.2)

## 2022-07-17 MED ORDER — NICOTINE 21 MG/24HR TD PT24
21.0000 mg | MEDICATED_PATCH | Freq: Every day | TRANSDERMAL | Status: DC
  Administered 2022-07-17 – 2022-07-20 (×4): 21 mg via TRANSDERMAL
  Filled 2022-07-17 (×4): qty 1

## 2022-07-17 MED ORDER — TRAZODONE HCL 50 MG PO TABS
25.0000 mg | ORAL_TABLET | Freq: Once | ORAL | Status: AC
  Administered 2022-07-17: 25 mg via ORAL
  Filled 2022-07-17: qty 1

## 2022-07-17 MED ORDER — GABAPENTIN 400 MG PO CAPS
400.0000 mg | ORAL_CAPSULE | Freq: Three times a day (TID) | ORAL | Status: DC
  Administered 2022-07-17 – 2022-07-20 (×8): 400 mg via ORAL
  Filled 2022-07-17 (×8): qty 1

## 2022-07-17 NOTE — Progress Notes (Signed)
Mobility Specialist - Progress Note   07/17/22 1222  Mobility  Activity Ambulated with assistance in hallway  Level of Assistance Contact guard assist, steadying assist  Assistive Device Front wheel walker  Distance Ambulated (ft) 50 ft  Range of Motion/Exercises Active Assistive  Activity Response Tolerated fair  Mobility Referral Yes  $Mobility charge 1 Mobility  Mobility Specialist Start Time (ACUTE ONLY) 1211  Mobility Specialist Stop Time (ACUTE ONLY) 1222  Mobility Specialist Time Calculation (min) (ACUTE ONLY) 11 min   Pt was found in bed and agreeable to ambulate. Stated being weak due to previous falls. Had x2 failed STS stating his legs were weak. Pt able to stand on third attempt. Was a little unsteady during ambulation. Had x2 L knee buckles upon returning to room. At EOS returned to bed with all needs met. Call bell in reach.  Joseph Ruiz Mobility Specialist

## 2022-07-17 NOTE — Progress Notes (Signed)
Transition of Care [facility]) - Inpatient Brief Assessment   Patient Details  Name: Joseph Ruiz MRN: [id number] Date of Birth: 02-28-1975  Transition of Care [facility]) CM/SW Contact:    Larrie Kass, LCSW Phone Number: 07/17/2022, 12:47 PM   Clinical Narrative: Csw spoke with pt he is requesting transportation assistance when he discharge. TOC to follow for d/c needs    Transition of Care Asessment: Insurance and Status: Insurance coverage has been reviewed Patient has primary care physician: Yes Home environment has been reviewed: yes Prior level of function:: modifed independent Prior/Current Home Services: No current home services Social Determinants of Health Reivew: SDOH reviewed needs interventions Readmission risk has been reviewed: Yes Transition of care needs: transition of care needs identified, TOC will continue to follow

## 2022-07-17 NOTE — Progress Notes (Signed)
[facility]  PROGRESS NOTE  Joseph Ruiz [id number]:[id number] DOB: 1975-06-25 DOA: 07/16/2022 PCP: Shelle Iron, MD   Brief HPI:   47 year old male with medical history of hypertension, chronic pain from degenerative disc disease, COPD on room air came to hospital with dehydration and acute kidney injury.  Patient started having epigastric, right sided flank pain.  Also had chills, watery diarrhea.  He takes HCTZ and lisinopril at home for hypertension. In the ED he was found to have BP of 68/46, blood pressure improved after IV fluid bolus to 112/60, also started on IV Rocephin for abnormal UA    Assessment/Plan:   Acute kidney injury -Likely in setting of dehydration/hypotension -Patient has been on HD TG and lisinopril at home, currently on hold -Renal function has improved to 1.0 with IV fluids  UTI -Presented with dysuria and leukocytosis -WBC is down to 8000 -Found to have abnormal UA; urine culture growing E. coli -Started on IV Rocephin -Follow culture and sensitivity results  History of hypertension -Patient was hypotensive when he came -Antihypertensive medications on hold -Blood pressure has improved -Continue IV metoprolol as needed  Degenerative disc disease -Continue Neurontin  Medications     aspirin EC  81 mg Oral Daily   feeding supplement  237 mL Oral BID BM   gabapentin  300 mg Oral TID   heparin  5,000 Units Subcutaneous Q8H   nicotine  21 mg Transdermal Daily     Data Reviewed:   CBG:  No results for input(s): "GLUCAP" in the last 168 hours.  SpO2: 99 %    Vitals:   07/16/22 1957 07/16/22 2351 07/17/22 0455 07/17/22 1307  BP: (!) 119/58 105/65 (!) 117/56 122/64  Pulse: 84 69 65 65  Resp: 20 19 16 20   Temp: 98.6 F (37 C) 98.4 F (36.9 C) 98.4 F (36.9 C) 98.6 F (37 C)  TempSrc: Oral Oral Oral Oral  SpO2: 99% 99% 97% 99%  Weight:      Height:          Data Reviewed:  Basic Metabolic Panel: Recent Labs  Lab  07/16/22 0639 07/16/22 1237 07/17/22 0354  NA 135 137 139  K 3.5 3.6 4.2  CL 100 108 111  CO2 20* 18* 18*  GLUCOSE 118* 119* 93  BUN 46* 47* 37*  CREATININE 4.62* 2.93* 1.00  CALCIUM 9.0 8.1* 8.1*    CBC: Recent Labs  Lab 07/16/22 0639 07/17/22 0354  WBC 15.6* 8.2  NEUTROABS 10.5*  --   HGB 13.8 10.8*  HCT 42.6 34.0*  MCV 88.0 90.7  PLT 365 260    LFT Recent Labs  Lab 07/16/22 0639  AST 25  ALT 25  ALKPHOS 90  BILITOT 0.3  PROT 8.7*  ALBUMIN 4.2     Antibiotics: Anti-infectives (From admission, onward)    Start     Dose/Rate Route Frequency Ordered Stop   07/17/22 1000  cefTRIAXone (ROCEPHIN) 2 g in sodium chloride 0.9 % 100 mL IVPB        2 g 200 mL/hr over 30 Minutes Intravenous Every 24 hours 07/16/22 1044 07/21/22 0959   07/16/22 0915  cefTRIAXone (ROCEPHIN) 2 g in sodium chloride 0.9 % 100 mL IVPB        2 g 200 mL/hr over 30 Minutes Intravenous  Once 07/16/22 0909 07/16/22 1032        DVT prophylaxis: Heparin  Code Status: Full code  Family Communication:    CONSULTS    Subjective  Denies pain.   Objective    Physical Examination:   General: Appears in no acute distress Cardiovascular: S1-S2, regular Respiratory: Lungs clear to auscultation bilaterally Abdomen: Soft, nontender, no organomegaly Extremities: No edema in the lower extremities Neurologic: Alert, oriented x 3, no focal deficit noted   Status is: Inpatient:             Meredeth Ide   [facility] If 7PM-7AM, please contact night-coverage at [email or web address], Office  [phone]   07/17/2022, 3:31 PM  LOS: 0 days

## 2022-07-17 NOTE — Care Management Obs Status (Signed)
MEDICARE OBSERVATION STATUS NOTIFICATION   Patient Details  Name: Joseph Ruiz MRN: [id number] Date of Birth: January 03, 1976   Medicare Observation Status Notification Given:  Yes    Larrie Kass, LCSW 07/17/2022, 12:05 PM

## 2022-07-18 DIAGNOSIS — N390 Urinary tract infection, site not specified: Secondary | ICD-10-CM

## 2022-07-18 DIAGNOSIS — R319 Hematuria, unspecified: Secondary | ICD-10-CM | POA: Diagnosis not present

## 2022-07-18 DIAGNOSIS — N179 Acute kidney failure, unspecified: Secondary | ICD-10-CM | POA: Diagnosis not present

## 2022-07-18 LAB — CBC
HCT: 32.1 % — ABNORMAL LOW (ref 39.0–52.0)
Hemoglobin: 10.2 g/dL — ABNORMAL LOW (ref 13.0–17.0)
MCH: 29 pg (ref 26.0–34.0)
MCHC: 31.8 g/dL (ref 30.0–36.0)
MCV: 91.2 fL (ref 80.0–100.0)
Platelets: 240 10*3/uL (ref 150–400)
RBC: 3.52 MIL/uL — ABNORMAL LOW (ref 4.22–5.81)
RDW: 14.6 % (ref 11.5–15.5)
WBC: 7.7 10*3/uL (ref 4.0–10.5)
nRBC: 0 % (ref 0.0–0.2)

## 2022-07-18 LAB — COMPREHENSIVE METABOLIC PANEL
ALT: 15 U/L (ref 0–44)
AST: 15 U/L (ref 15–41)
Albumin: 2.7 g/dL — ABNORMAL LOW (ref 3.5–5.0)
Alkaline Phosphatase: 68 U/L (ref 38–126)
Anion gap: 7 (ref 5–15)
BUN: 24 mg/dL — ABNORMAL HIGH (ref 6–20)
CO2: 21 mmol/L — ABNORMAL LOW (ref 22–32)
Calcium: 8.2 mg/dL — ABNORMAL LOW (ref 8.9–10.3)
Chloride: 112 mmol/L — ABNORMAL HIGH (ref 98–111)
Creatinine, Ser: 0.82 mg/dL (ref 0.61–1.24)
GFR, Estimated: >60 mL/min (ref >60)
Glucose, Bld: 91 mg/dL (ref 70–99)
Potassium: 4.2 mmol/L (ref 3.5–5.1)
Sodium: 140 mmol/L (ref 135–145)
Total Bilirubin: 0.5 mg/dL (ref 0.3–1.2)
Total Protein: 5.5 g/dL — ABNORMAL LOW (ref 6.5–8.1)

## 2022-07-18 LAB — URINE CULTURE: Culture: >=100000 — AB

## 2022-07-18 MED ORDER — CEFADROXIL 500 MG PO CAPS: 500.0000 mg | ORAL_CAPSULE | Freq: Two times a day (BID) | ORAL | Status: DC

## 2022-07-18 MED ORDER — POLYETHYLENE GLYCOL 3350 17 G PO PACK
17.0000 g | PACK | Freq: Once | ORAL | Status: AC
  Administered 2022-07-18: 17 g via ORAL
  Filled 2022-07-18: qty 1

## 2022-07-18 MED ORDER — CEFADROXIL 500 MG PO CAPS
1000.0000 mg | ORAL_CAPSULE | Freq: Two times a day (BID) | ORAL | Status: DC
  Administered 2022-07-19 – 2022-07-20 (×3): 1000 mg via ORAL
  Filled 2022-07-18 (×3): qty 2

## 2022-07-18 NOTE — Progress Notes (Signed)
  Progress Note   Patient: Joseph Ruiz [id number]:[id number] DOB: [birth year] DOA: 07/16/2022     1 DOS: the patient was seen and examined on 07/18/2022   Brief hospital course: 47 year old male with medical history of hypertension, chronic pain from degenerative disc disease, COPD on room air came to hospital with dehydration and acute kidney injury.  Patient started having epigastric, right sided flank pain.  Also had chills, watery diarrhea.  He takes HCTZ and lisinopril at home for hypertension. In the ED he was found to have BP of 68/46, blood pressure improved after IV fluid bolus to 112/60, also started on IV Rocephin for abnormal UA  Assessment and Plan: Acute kidney injury -Likely in setting of dehydration/hypotension -Renal function has normalized with IVF -holding off on diuretic, ACEI PTA -Will hold further IVF for now   UTI -Presented with dysuria and leukocytosis -WBC normalized -Found to have abnormal UA; urine culture growing pan-sensitive E. coli -had been on IV Rocephin -will change to cefadroxil to complete course   History of hypertension -Patient was hypotensive when he came -Antihypertensive medications on hold -Blood pressure has improved -Continue IV metoprolol as needed -BP remains stable   Degenerative disc disease -Continue Neurontin -Pt having some difficulty ambulating with therapy today. Mobility specialist reported pt had "some leg buckling throughout session" -Suspect known neuropathy possibly made worse following above infection -PT was consulted -Pt is interested in SNF. TOC consulted   Subjective: Feeling better, complaining of LE weakness  Physical Exam: Vitals:   07/17/22 1307 07/17/22 2150 07/18/22 0523 07/18/22 1400  BP: 122/64 139/69 113/64 136/77  Pulse: 65 73 64 64  Resp: 20 14 14 15   Temp: 98.6 F (37 C) 98.2 F (36.8 C) 98.1 F (36.7 C) 98.4 F (36.9 C)  TempSrc: Oral Oral Oral Oral  SpO2: 99% 100% 100% 100%  Weight:       Height:       General exam: Awake, laying in bed, in nad Respiratory system: Normal respiratory effort, no wheezing Cardiovascular system: regular rate, s1, s2 Gastrointestinal system: Soft, nondistended, positive BS Central nervous system: CN2-12 grossly intact, strength intact Extremities: Perfused, no clubbing Skin: Normal skin turgor, no notable skin lesions seen Psychiatry: Mood normal // no visual hallucinations   Data Reviewed:  Labs reviewed: Na 140, K 4.2, Cr 0.82  Family Communication: Pt in room, family not at bedside  Disposition: Status is: Inpatient Remains inpatient appropriate because: Severity of illness  Planned Discharge Destination: Skilled nursing facility     Author: Rickey Barbara, MD 07/18/2022 3:17 PM  For on call review [email or web address].

## 2022-07-18 NOTE — Evaluation (Addendum)
Physical Therapy Evaluation Patient Details Name: Joseph Ruiz MRN: [id number] DOB: 06-19-1975 Today's Date: 07/18/2022  History of Present Illness  47 yo male presents to therapy s/p hospital admission pm 07/16/2022 due to epigastric and R side flank pain, fall, chills and loose bowel. Pt was found to be hypotensive and abn UA. Imaging negative for acute findings and pt dx with AKI and UTI. Pt PMH includes but is not limited to: COPD, DDD, DM II, DVT, HTN, kidney stone, back sx, kidney sx and cholecystectomy.  Clinical Impression    Pt admitted with above diagnosis.  Pt currently with functional limitations due to the deficits listed below (see PT Problem List). Pt reports 7/10 R LE pain, noted poor motor control, coordination and strength, pt attributes to most recent fall, mm weakness and abn sensation impacting safety and I with gait tasks. Pt requires S for bed mobility, min guard for transfers, min A for gait due to instability 60 feet with reports of fatigue. Pt reported no dizziness and Bp assessed in sitting and standing, see below. Pt left seated in recliner all needs in place, nursing staff aware of R LE instability and pt pain report. Pt will benefit from acute skilled PT to increase their independence and safety with mobility to allow discharge.    Bp seated 130/76 (67 PR) Bp immediate standing 136/87 (92 PR) S/p 3 min stand Bp 134/88 (84 PR) no reports of dizziness during PT eval.       Recommendations for follow up therapy are one component of a multi-disciplinary discharge planning process, led by the attending physician.  Recommendations may be updated based on patient status, additional functional criteria and insurance authorization.  Follow Up Recommendations Can patient physically be transported by private vehicle: Yes     Assistance Recommended at Discharge Intermittent Supervision/Assistance  Patient can return home with the following  A little help with walking and/or  transfers;A little help with bathing/dressing/bathroom;Assistance with cooking/housework;Assist for transportation    Equipment Recommendations Rolling walker (2 wheels)  Recommendations for Other Services       Functional Status Assessment Patient has had a recent decline in their functional status and demonstrates the ability to make significant improvements in function in a reasonable and predictable amount of time.     Precautions / Restrictions Precautions Precautions: Fall Restrictions Weight Bearing Restrictions: No      Mobility  Bed Mobility Overal bed mobility: Needs Assistance Bed Mobility: Supine to Sit     Supine to sit: Supervision, HOB elevated     General bed mobility comments: pt reports he is sometimes able to sit EOB by himself    Transfers Overall transfer level: Needs assistance Equipment used: Rolling walker (2 wheels) Transfers: Sit to/from Stand Sit to Stand: Min guard           General transfer comment: cues for proper UE placement    Ambulation/Gait Ambulation/Gait assistance: Min assist Gait Distance (Feet): 60 Feet Assistive device: Rolling walker (2 wheels) Gait Pattern/deviations: Shuffle, Decreased stance time - right, Step-to pattern, Decreased dorsiflexion - right, Knees buckling, Antalgic Gait velocity: decreased     General Gait Details: noted R LE instability worsening with fatigue, poor R foot clearance, R toe out and heavy reliance on B UE support at [employer]    Modified Rankin (Stroke Patients Only)       Balance Overall balance assessment: History of Falls, Needs  assistance Sitting-balance support: Feet supported Sitting balance-Leahy Scale: Good     Standing balance support: Bilateral upper extremity supported, During functional activity, Reliant on assistive device for balance Standing balance-Leahy Scale: Poor                               Pertinent  Vitals/Pain Pain Assessment Pain Assessment: 0-10 Pain Score: 7  Pain Location: R LE Pain Descriptors / Indicators: Aching, Burning, Constant, Discomfort Pain Intervention(s): Limited activity within patient's tolerance, Monitored during session    Home Living Family/patient expects to be discharged to:: Private residence Living Arrangements: Parent Available Help at Discharge: Family Type of Home: House Home Access: Ramped entrance       Home Layout: Two level;Able to live on main level with bedroom/bathroom Home Equipment: None      Prior Function Prior Level of Function : Independent/Modified Independent;History of Falls (last six months) (4 falls in the past 6 months, one of wich resulting in R hand/wrist fx, s/p sx and I and D)             Mobility Comments: pt reports IND no AD for all ADLs, self care tasks and IADLs       Hand Dominance        Extremity/Trunk Assessment        Lower Extremity Assessment Lower Extremity Assessment: RLE deficits/detail RLE Deficits / Details: ankle DF 3-/5, PF 4/5, hip flexion 3/5, knee extension 3/5, knee flexion 3+/5 RLE Sensation: decreased light touch;decreased proprioception RLE Coordination: decreased gross motor    Cervical / Trunk Assessment Cervical / Trunk Assessment:  (head forward)  Communication   Communication: No difficulties  Cognition Arousal/Alertness: Awake/alert Behavior During Therapy: WFL for tasks assessed/performed Overall Cognitive Status: Within Functional Limits for tasks assessed                                          General Comments      Exercises     Assessment/Plan    PT Assessment Patient needs continued PT services  PT Problem List Decreased strength;Decreased range of motion;Decreased activity tolerance;Decreased balance;Decreased mobility;Decreased coordination;Impaired sensation;Pain       PT Treatment Interventions DME instruction;Gait  training;Functional mobility training;Therapeutic activities;Therapeutic exercise;Balance training;Neuromuscular re-education;Patient/family education    PT Goals (Current goals can be found in the Care Plan section)  Acute Rehab PT Goals Patient Stated Goal: to go home and not fall PT Goal Formulation: With patient Time For Goal Achievement: 08/01/22 Potential to Achieve Goals: Good    Frequency Min 1X/week     Co-evaluation               AM-PAC PT "6 Clicks" Mobility  Outcome Measure Help needed turning from your back to your side while in a flat bed without using bedrails?: A Little Help needed moving from lying on your back to sitting on the side of a flat bed without using bedrails?: A Little Help needed moving to and from a bed to a chair (including a wheelchair)?: A Little Help needed standing up from a chair using your arms (e.g., wheelchair or bedside chair)?: A Little Help needed to walk in hospital room?: A Little Help needed climbing 3-5 steps with a railing? : Total 6 Click Score: 16    End of Session Equipment Utilized During Treatment: Gait belt  Activity Tolerance: Patient limited by fatigue;Patient limited by pain Patient left: in chair;with call bell/phone within reach;with chair alarm set Nurse Communication: Mobility status PT Visit Diagnosis: Unsteadiness on feet (R26.81);Other abnormalities of gait and mobility (R26.89);Repeated falls (R29.6);Muscle weakness (generalized) (M62.81);Difficulty in walking, not elsewhere classified (R26.2);Pain Pain - Right/Left: Right Pain - part of body: Knee    Time: 4401-0272 PT Time Calculation (min) (ACUTE ONLY): 28 min   Charges:   PT Evaluation $PT Eval Low Complexity: 1 Low PT Treatments $Gait Training: 8-22 mins        Johnny Bridge, PT Acute Rehab   Jacqualyn Posey 07/18/2022, 3:43 PM

## 2022-07-18 NOTE — Progress Notes (Signed)
Mobility Specialist - Progress Note   07/18/22 1247  Mobility  Activity Ambulated with assistance in hallway  Level of Assistance Minimal assist, patient does 75% or more  Assistive Device Front wheel walker  Distance Ambulated (ft) 60 ft  Range of Motion/Exercises Active  Activity Response Tolerated well  Mobility Referral Yes  $Mobility charge 1 Mobility  Mobility Specialist Start Time (ACUTE ONLY) 1234  Mobility Specialist Stop Time (ACUTE ONLY) 1248  Mobility Specialist Time Calculation (min) (ACUTE ONLY) 14 min   Pt received in chair and agreed to mobility. Had some leg buckling throughout session, R leg tended to drag more than L, pt returned to bed with all needs met.  Marilynne Halsted Mobility Specialist

## 2022-07-18 NOTE — Hospital Course (Signed)
47 year old male with medical history of hypertension, chronic pain from degenerative disc disease, COPD on room air came to hospital with dehydration and acute kidney injury.  Patient started having epigastric, right sided flank pain.  Also had chills, watery diarrhea.  He takes HCTZ and lisinopril at home for hypertension. In the ED he was found to have BP of 68/46, blood pressure improved after IV fluid bolus to 112/60, also started on IV Rocephin for abnormal UA

## 2022-07-19 DIAGNOSIS — N3000 Acute cystitis without hematuria: Secondary | ICD-10-CM | POA: Diagnosis not present

## 2022-07-19 DIAGNOSIS — N179 Acute kidney failure, unspecified: Secondary | ICD-10-CM | POA: Diagnosis not present

## 2022-07-19 NOTE — Progress Notes (Signed)
Physical Therapy Treatment Patient Details Name: Joseph Ruiz MRN: [id number] DOB: May 17, 1975 Today's Date: 07/19/2022   History of Present Illness 47 yo male presents to therapy s/p hospital admission pm 07/16/2022 due to epigastric and R side flank pain, fall, chills and loose bowel. Pt was found to be hypotensive and abn UA. Imaging negative for acute findings and pt dx with AKI and UTI. Pt PMH includes but is not limited to: COPD, DDD, DM II, DVT, HTN, kidney stone, back sx, kidney sx and cholecystectomy.    PT Comments     Pt admitted with above diagnosis.  Pt currently with functional limitations due to the deficits listed below (see PT Problem List). Pt in bed when PT arrived. Pt had just been provided with pain mediation and R LE 8/10. Pt demonstrated supine to sit with HOB elevated with S, min guard and cues for sit to stand from EOB to rollator. Pt had requested trial for rollator vs RW. Pt and PT in agreement per safest and most stable AD RW at this time. Pt continues to exhibit R LE instability with gait tasks. Pt left seated in recliner all needs met. Pt will benefit from acute skilled PT to increase their independence and safety with mobility to allow discharge.     Recommendations for follow up therapy are one component of a multi-disciplinary discharge planning process, led by the attending physician.  Recommendations may be updated based on patient status, additional functional criteria and insurance authorization.  Follow Up Recommendations  Can patient physically be transported by private vehicle: Yes    Assistance Recommended at Discharge Intermittent Supervision/Assistance  Patient can return home with the following A little help with walking and/or transfers;A little help with bathing/dressing/bathroom;Assistance with cooking/housework;Assist for transportation   Equipment Recommendations  Rolling walker (2 wheels)    Recommendations for Other Services        Precautions / Restrictions Precautions Precautions: Fall Restrictions Weight Bearing Restrictions: No     Mobility  Bed Mobility Overal bed mobility: Needs Assistance Bed Mobility: Supine to Sit     Supine to sit: HOB elevated, Modified independent (Device/Increase time)          Transfers Overall transfer level: Needs assistance Equipment used: Rollator (4 wheels) Transfers: Sit to/from Stand Sit to Stand: Min guard           General transfer comment: cues for proper UE placement and rollator brake management    Ambulation/Gait Ambulation/Gait assistance: Min assist Gait Distance (Feet): 55 Feet Assistive device: Rollator (4 wheels) Gait Pattern/deviations: Shuffle, Decreased stance time - right, Step-to pattern, Decreased dorsiflexion - right, Knees buckling, Antalgic Gait velocity: decreased     General Gait Details: noted R LE instability worsening with fatigue, poor R foot clearance, R toe out and heavy reliance on B UE support at AD. Trial with rollator per pt request, ed provided on brake management, safety with turn and sitting on rollator, risk and benefits of mobiltiy associated wtih rollator vs stability with RW. pt demonstrated and reported improved stability and safety with use of RW and indicated that he thought he would just be able to sit on the rollator if he was feeling light headed and did not realize he would have to turn to sit or lock brakes and does not think he would be able to make that transition if he were going to pass out. PT in agreement that currently RW is safest and most stable AD for pt. pt amb 50 feet  with RW following rollator trial and able to demonstrate improved R foot clearance and 2 episodes of R LE instability   Stairs             Wheelchair Mobility    Modified Rankin (Stroke Patients Only)       Balance Overall balance assessment: History of Falls, Needs assistance Sitting-balance support: Feet supported Sitting  balance-Leahy Scale: Good     Standing balance support: Bilateral upper extremity supported, During functional activity, Reliant on assistive device for balance Standing balance-Leahy Scale: Fair Standing balance comment: pt participated with static standing balance challenges without UE support with minimal external peruterbation with posterior LOB and min A to recover                            Cognition Arousal/Alertness: Awake/alert Behavior During Therapy: WFL for tasks assessed/performed Overall Cognitive Status: Within Functional Limits for tasks assessed                                          Exercises General Exercises - Lower Extremity Mini-Sqauts: AROM, Both, 5 reps (sit to stand from recliner with a focus on initital standing balance without UE support)    General Comments        Pertinent Vitals/Pain Pain Assessment Pain Assessment: 0-10 Pain Score: 8  Pain Location: R LE Pain Descriptors / Indicators: Aching, Burning, Constant, Discomfort Pain Intervention(s): Limited activity within patient's tolerance, Monitored during session, Premedicated before session    Home Living                          Prior Function            PT Goals (current goals can now be found in the care plan section) Acute Rehab PT Goals Patient Stated Goal: to go home and not fall PT Goal Formulation: With patient Time For Goal Achievement: 08/01/22 Potential to Achieve Goals: Good Progress towards PT goals: Progressing toward goals    Frequency    Min 1X/week      PT Plan Current plan remains appropriate    Co-evaluation              AM-PAC PT "6 Clicks" Mobility   Outcome Measure  Help needed turning from your back to your side while in a flat bed without using bedrails?: A Little Help needed moving from lying on your back to sitting on the side of a flat bed without using bedrails?: A Little Help needed moving to and  from a bed to a chair (including a wheelchair)?: A Little Help needed standing up from a chair using your arms (e.g., wheelchair or bedside chair)?: A Little Help needed to walk in hospital room?: A Little Help needed climbing 3-5 steps with a railing? : Total 6 Click Score: 16    End of Session Equipment Utilized During Treatment: Gait belt Activity Tolerance: Patient limited by fatigue;Patient limited by pain Patient left: in chair;with call bell/phone within reach;with chair alarm set Nurse Communication: Mobility status PT Visit Diagnosis: Unsteadiness on feet (R26.81);Other abnormalities of gait and mobility (R26.89);Repeated falls (R29.6);Muscle weakness (generalized) (M62.81);Difficulty in walking, not elsewhere classified (R26.2);Pain Pain - Right/Left: Right Pain - part of body: Knee     Time: 1308-6578 PT Time Calculation (min) (ACUTE ONLY): 24 min  Charges:  $Gait Training: 8-22 mins $Therapeutic Activity: 8-22 mins                     Johnny Bridge, PT Acute Rehab    Jacqualyn Posey 07/19/2022, 1:09 PM

## 2022-07-19 NOTE — Plan of Care (Signed)

## 2022-07-19 NOTE — Progress Notes (Signed)
PROGRESS NOTE    EARLY STEEL  ZOX:[id number] DOB: [birth year] DOA: 07/16/2022 PCP: Shelle Iron, MD   Brief Narrative:  47 year old male with medical history of hypertension, chronic pain from degenerative disc disease, COPD presented with epigastric and right-sided flank pain along with chills and watery diarrhea.  On presentation, he was hypotensive and was found to be dehydrated with acute kidney injury.  He was started on IV fluids and IV antibiotics.  Assessment & Plan:   Acute kidney injury -Possibly from dehydration and hypotension -Renal function has normalized with IV fluids.  Creatinine 0.82 on 07/18/2022. -Diuretics and ACE inhibitors on hold  E. coli UTI: Present on admission -Initially started on IV Rocephin but has been changed to oral cefadroxil to complete course.  Normocytic anemia -Questionable cause.  Hemoglobin stable.  Monitor intermittently.  Hypertension -Blood pressure stable.  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  PT recommending SNF placement.  TOC following.  Obesity -Outpatient follow-up    DVT prophylaxis: Heparin subcutaneous Code Status: Full Family Communication: None at bedside Disposition Plan: Status is: Inpatient Remains inpatient appropriate because: Of severity of illness.  Need for SNF placement.  Currently medically stable for discharge  Consultants: None  Procedures: None  Antimicrobials:  Anti-infectives (From admission, onward)    Start     Dose/Rate Route Frequency Ordered Stop   07/19/22 1000  cefadroxil (DURICEF) capsule 500 mg  Status:  Discontinued        500 mg Oral 2 times daily 07/18/22 1528 07/18/22 1534   07/19/22 1000  cefadroxil (DURICEF) capsule 1,000 mg        1,000 mg Oral 2 times daily 07/18/22 1534 07/21/22 0959   07/17/22  1000  cefTRIAXone (ROCEPHIN) 2 g in sodium chloride 0.9 % 100 mL IVPB  Status:  Discontinued        2 g 200 mL/hr over 30 Minutes Intravenous Every 24 hours 07/16/22 1044 07/18/22 1528   07/16/22 0915  cefTRIAXone (ROCEPHIN) 2 g in sodium chloride 0.9 % 100 mL IVPB        2 g 200 mL/hr over 30 Minutes Intravenous  Once 07/16/22 0909 07/16/22 1032         Subjective: Patient seen and examined at bedside.  Still feels weak.  No fever, vomiting or abdominal pain reported. Objective: Vitals:   07/18/22 0523 07/18/22 1400 07/18/22 2031 07/19/22 0429  BP: 113/64 136/77 99/85 125/73  Pulse: 64 64 64 63  Resp: 14 15 18 17   Temp: 98.1 F (36.7 C) 98.4 F (36.9 C) 98.4 F (36.9 C) 98.2 F (36.8 C)  TempSrc: Oral Oral Oral Oral  SpO2: 100% 100% 100% 100%  Weight:      Height:        Intake/Output Summary (Last 24 hours) at 07/19/2022 1038 Last data filed at 07/19/2022 0916 Gross per 24 hour  Intake 2961.89 ml  Output 3375 ml  Net -413.11 ml   Filed Weights   07/16/22 0608 07/16/22 1156  Weight: 113.4 kg 108.7 kg    Examination:  General exam: Appears calm and comfortable.  On room air. Respiratory system: Bilateral decreased breath sounds at bases, no wheezing Cardiovascular system: S1 & S2 heard, Rate controlled Gastrointestinal system: Abdomen is obese, nondistended, soft and nontender. Normal bowel sounds heard. Extremities: No cyanosis, clubbing, edema   Data Reviewed: I have personally reviewed following labs  and imaging studies  CBC: Recent Labs  Lab 07/16/22 0639 07/17/22 0354 07/18/22 0416  WBC 15.6* 8.2 7.7  NEUTROABS 10.5*  --   --   HGB 13.8 10.8* 10.2*  HCT 42.6 34.0* 32.1*  MCV 88.0 90.7 91.2  PLT 365 260 240   Basic Metabolic Panel: Recent Labs  Lab 07/16/22 0639 07/16/22 1237 07/17/22 0354 07/18/22 0416  NA 135 137 139 140  K 3.5 3.6 4.2 4.2  CL 100 108 111 112*  CO2 20* 18* 18* 21*  GLUCOSE 118* 119* 93 91  BUN 46* 47* 37* 24*   CREATININE 4.62* 2.93* 1.00 0.82  CALCIUM 9.0 8.1* 8.1* 8.2*   GFR: Estimated Creatinine Clearance: 135.3 mL/min (by C-G formula based on SCr of 0.82 mg/dL). Liver Function Tests: Recent Labs  Lab 07/16/22 0639 07/18/22 0416  AST 25 15  ALT 25 15  ALKPHOS 90 68  BILITOT 0.3 0.5  PROT 8.7* 5.5*  ALBUMIN 4.2 2.7*   Recent Labs  Lab 07/16/22 0639  LIPASE 54*   No results for input(s): "AMMONIA" in the last 168 hours. Coagulation Profile: Recent Labs  Lab 07/16/22 0639  INR 1.0   Cardiac Enzymes: Recent Labs  Lab 07/16/22 0827  CKTOTAL 36*   BNP (last 3 results) No results for input(s): "PROBNP" in the last 8760 hours. HbA1C: No results for input(s): "HGBA1C" in the last 72 hours. CBG: No results for input(s): "GLUCAP" in the last 168 hours. Lipid Profile: No results for input(s): "CHOL", "HDL", "LDLCALC", "TRIG", "CHOLHDL", "LDLDIRECT" in the last 72 hours. Thyroid Function Tests: No results for input(s): "TSH", "T4TOTAL", "FREET4", "T3FREE", "THYROIDAB" in the last 72 hours. Anemia Panel: No results for input(s): "VITAMINB12", "FOLATE", "FERRITIN", "TIBC", "IRON", "RETICCTPCT" in the last 72 hours. Sepsis Labs: Recent Labs  Lab 07/16/22 8119  LATICACIDVEN 1.2    Recent Results (from the past 240 hour(s))  Urine Culture     Status: Abnormal   Collection Time: 07/16/22  8:20 AM   Specimen: Urine, Clean Catch  Result Value Ref Range Status   Specimen Description   Final    URINE, CLEAN CATCH Performed at [facility], [address] [place], [place] [address]    Special Requests   Final    NONE Performed at [facility], [address], [place], [place] [address]    Culture >=100,000 COLONIES/mL ESCHERICHIA COLI (A)  Final   Report Status 07/18/2022 FINAL  Final   Organism ID, Bacteria ESCHERICHIA COLI (A)  Final      Susceptibility   Escherichia coli - MIC*    AMPICILLIN 8 SENSITIVE Sensitive     CEFAZOLIN  <=4 SENSITIVE Sensitive     CEFEPIME <=0.12 SENSITIVE Sensitive     CEFTRIAXONE <=0.25 SENSITIVE Sensitive     CIPROFLOXACIN <=0.25 SENSITIVE Sensitive     GENTAMICIN <=1 SENSITIVE Sensitive     IMIPENEM <=0.25 SENSITIVE Sensitive     NITROFURANTOIN <=16 SENSITIVE Sensitive     TRIMETH/SULFA <=20 SENSITIVE Sensitive     AMPICILLIN/SULBACTAM <=2 SENSITIVE Sensitive     PIP/TAZO <=4 SENSITIVE Sensitive     * >=100,000 COLONIES/mL ESCHERICHIA COLI         Radiology Studies: No results found.      Scheduled Meds:  aspirin EC  81 mg Oral Daily   cefadroxil  1,000 mg Oral BID   feeding supplement  237 mL Oral BID BM   gabapentin  400 mg Oral TID   heparin  5,000 Units Subcutaneous Q8H   nicotine  21 mg Transdermal Daily   Continuous Infusions:        Glade Lloyd, MD [facility] 07/19/2022, 10:38 AM

## 2022-07-19 NOTE — [place] FL2 (Signed)
[place] MEDICAID FL2 LEVEL OF CARE FORM     IDENTIFICATION  Patient Name: Joseph Ruiz Birthdate: [birth year] Sex: male Admission Date (Current Location): 07/16/2022  [facility] and [place] Number:  Producer, television/film/video and Address:  [facility],  [address], [place] [address]      Provider Number: [id number]  Attending Physician Name and Address:  Glade Lloyd, MD  Relative Name and Phone Number:       Current Level of Care: Hospital Recommended Level of Care: Skilled Nursing Facility Prior Approval Number:    Date Approved/Denied:   PASRR Number: pending  Discharge Plan: SNF    Current Diagnoses: Patient Active Problem List   Diagnosis Date Noted   UTI (urinary tract infection) 07/17/2022   AKI (acute kidney injury) (HCC) 07/16/2022   Dyspnea on exertion 05/10/2017   Atypical chest pain 05/10/2017   Borderline diabetes 05/10/2017   Smoking 05/10/2017   Snoring 05/10/2017   Dizziness 05/03/2017   Intermittent palpitations 05/03/2017   Sciatica 10/03/2013    Orientation RESPIRATION BLADDER Height & Weight     Self, Time, Situation, Place  Normal Continent Weight: 239 lb 10.2 oz (108.7 kg) Height:  5\' 9"  (175.3 cm)  BEHAVIORAL SYMPTOMS/MOOD NEUROLOGICAL BOWEL NUTRITION STATUS      Continent Diet (regular)  AMBULATORY STATUS COMMUNICATION OF NEEDS Skin   Limited Assist Verbally Normal                       Personal Care Assistance Level of Assistance  Bathing, Feeding, Dressing Bathing Assistance: Limited assistance Feeding assistance: Independent Dressing Assistance: Limited assistance     Functional Limitations Info  Sight, Hearing, Speech Sight Info: Adequate Hearing Info: Adequate Speech Info: Adequate    SPECIAL CARE FACTORS FREQUENCY  PT (By licensed PT), OT (By licensed OT)     PT Frequency: 5 x a week OT Frequency: 5 x a week            Contractures Contractures Info: Present (Left hand)    Additional  Factors Info  Code Status, Allergies Code Status Info: full Allergies Info: NKA           Current Medications (07/19/2022):  This is the current hospital active medication list Current Facility-Administered Medications  Medication Dose Route Frequency Provider Last Rate Last Admin   acetaminophen (TYLENOL) tablet 650 mg  650 mg Oral Q6H PRN Kirby Crigler, Mir M, MD   650 mg at 07/17/22 0545   Or   acetaminophen (TYLENOL) suppository 650 mg  650 mg Rectal Q6H PRN Kirby Crigler, Mir M, MD       albuterol (PROVENTIL) (2.5 MG/3ML) 0.083% nebulizer solution 2.5 mg  2.5 mg Nebulization Q2H PRN Kirby Crigler, Mir M, MD       aspirin EC tablet 81 mg  81 mg Oral Daily Kirby Crigler, Mir M, MD   81 mg at 07/19/22 0914   cefadroxil (DURICEF) capsule 1,000 mg  1,000 mg Oral BID Jerald Kief, MD   1,000 mg at 07/19/22 0914   gabapentin (NEURONTIN) capsule 400 mg  400 mg Oral TID Meredeth Ide, MD   400 mg at 07/19/22 0914   heparin injection 5,000 Units  5,000 Units Subcutaneous Q8H Kirby Crigler, Mir M, MD   5,000 Units at 07/19/22 8119   metoprolol tartrate (LOPRESSOR) injection 5 mg  5 mg Intravenous Q6H PRN Kirby Crigler, Mir M, MD       nicotine (NICODERM CQ - dosed in mg/24 hours) patch  21 mg  21 mg Transdermal Daily Meredeth Ide, MD   21 mg at 07/19/22 1022   ondansetron (ZOFRAN) tablet 4 mg  4 mg Oral Q6H PRN Maryln Gottron, MD   4 mg at 07/18/22 1506   Or   ondansetron (ZOFRAN) injection 4 mg  4 mg Intravenous Q6H PRN Maryln Gottron, MD   4 mg at 07/17/22 1812   Oral care mouth rinse  15 mL Mouth Rinse PRN Kirby Crigler, Mir M, MD       oxyCODONE (Oxy IR/ROXICODONE) immediate release tablet 5 mg  5 mg Oral Q4H PRN Maryln Gottron, MD   5 mg at 07/19/22 1105     Discharge Medications: Please see discharge summary for a list of discharge medications.  Relevant Imaging Results:  Relevant Lab Results:   Additional Information SSN [id number]  Valentina Shaggy Teal Raben, LCSW

## 2022-07-19 NOTE — TOC Progression Note (Signed)
Transition of Care [facility]) - Progression Note    Patient Details  Name: [name] MRN: [id number] Date of Birth: [birth year]  [facility]) CM/SW Contact  Larrie Kass, LCSW Phone Number: 07/19/2022, 12:18 PM  Clinical Narrative:    CSW spoke with pt regarding rec for SNF placement, pt was agreeable. Pt reported he does not want to go back to [facility].  CSW explained the process, pt will need insurance authorization. CSW faxed pt out for SNF.   Pt's PASRR has been screened for additional information. Pt's PASRR number is pending. TOC to follow.         Expected Discharge Plan and Services                                               Social Determinants of Health (SDOH) Interventions SDOH Screenings   Food Insecurity: No Food Insecurity (07/16/2022)  Housing: Low Risk  (07/16/2022)  Transportation Needs: No Transportation Needs (07/16/2022)  Utilities: Not At Risk (07/16/2022)  Tobacco Use: High Risk (07/16/2022)    Readmission Risk Interventions     No data to display

## 2022-07-19 NOTE — Plan of Care (Signed)
  Problem: Clinical Measurements: Goal: Ability to maintain clinical measurements within normal limits will improve Outcome: Progressing Goal: Diagnostic test results will improve Outcome: Progressing Goal: Respiratory complications will improve Outcome: Progressing Goal: Cardiovascular complication will be avoided Outcome: Progressing   Problem: Coping: Goal: Level of anxiety will decrease Outcome: Progressing   Problem: Safety: Goal: Ability to remain free from injury will improve Outcome: Progressing   

## 2022-07-19 NOTE — Progress Notes (Signed)
Initial Nutrition Assessment  DOCUMENTATION CODES:   Obesity unspecified  INTERVENTION:  - Continue Regular diet.   NUTRITION DIAGNOSIS:   Inadequate oral intake related to inability to eat as evidenced by per patient/family report.  GOAL:   Patient will meet greater than or equal to 90% of their needs  MONITOR:   PO intake  REASON FOR ASSESSMENT:   Malnutrition Screening Tool    ASSESSMENT:   47 y.o. admits related to syncope. PMH includes: COPD, DDD, DM, DVT, HTN. Pt is currently receiving medical management related to AKI.  Meds reviewed. Labs reviewed:  BUN elevated.   Pt reports that he has been eating well since admission. No significant wt loss per record. Pt reports that he had a poor appetite PTA for about 1-2 months PTA. Pt is meeting his needs at this time. RD will continue to monitor PO intakes.   NUTRITION - FOCUSED PHYSICAL EXAM:  WDL - no wasting noted.   Diet Order:   Diet Order             Diet regular Room service appropriate? Yes; Fluid consistency: Thin  Diet effective now                   EDUCATION NEEDS:   Not appropriate for education at this time  Skin:  Skin Assessment: Reviewed RN Assessment  Last BM:  6/26 - type 3  Height:   Ht Readings from Last 1 Encounters:  07/16/22 5\' 9"  (1.753 m)    Weight:   Wt Readings from Last 1 Encounters:  07/16/22 108.7 kg    Ideal Body Weight:     BMI:  Body mass index is 35.39 kg/m.  Estimated Nutritional Needs:   Kcal:  1630-2100 kcals  Protein:  80-105 gm  Fluid:  >/= 1.6 L  Bethann Humble, RD, LDN, CNSC.

## 2022-07-19 NOTE — Progress Notes (Signed)
Mobility Specialist - Progress Note   07/19/22 1504  Mobility  Activity Ambulated with assistance in hallway  Level of Assistance Standby assist, set-up cues, supervision of patient - no hands on  Assistive Device Front wheel walker  Distance Ambulated (ft) 100 ft  Activity Response Tolerated well  Mobility Referral Yes  $Mobility charge 1 Mobility  Mobility Specialist Start Time (ACUTE ONLY) 0251  Mobility Specialist Stop Time (ACUTE ONLY) 0303  Mobility Specialist Time Calculation (min) (ACUTE ONLY) 12 min   Pt received in bed and agreeable to mobility. Pt was MinA from STS. No complaints during session. Pt to bed after session with all needs met. Bed alarm on.  [facility]

## 2022-07-20 DIAGNOSIS — N179 Acute kidney failure, unspecified: Secondary | ICD-10-CM | POA: Diagnosis not present

## 2022-07-20 DIAGNOSIS — N3000 Acute cystitis without hematuria: Secondary | ICD-10-CM

## 2022-07-20 MED ORDER — ALUM & MAG HYDROXIDE-SIMETH 200-200-20 MG/5ML PO SUSP
30.0000 mL | ORAL | Status: DC | PRN
  Administered 2022-07-20: 30 mL via ORAL
  Filled 2022-07-20: qty 30

## 2022-07-20 MED ORDER — PANTOPRAZOLE SODIUM 40 MG PO TBEC
40.0000 mg | DELAYED_RELEASE_TABLET | Freq: Every day | ORAL | Status: DC
  Administered 2022-07-20: 40 mg via ORAL
  Filled 2022-07-20: qty 1

## 2022-07-20 MED ORDER — ALUM & MAG HYDROXIDE-SIMETH 200-200-20 MG/5ML PO SUSP: 30.0000 mL | ORAL | 0 refills | Status: AC | PRN

## 2022-07-20 MED ORDER — CEFADROXIL 500 MG PO CAPS: 1000.0000 mg | ORAL_CAPSULE | Freq: Two times a day (BID) | ORAL | 0 refills | Status: AC

## 2022-07-20 MED ORDER — PANTOPRAZOLE SODIUM 40 MG PO TBEC: 40.0000 mg | DELAYED_RELEASE_TABLET | Freq: Every day | ORAL | 0 refills | Status: AC

## 2022-07-20 NOTE — Progress Notes (Signed)
Mobility Specialist - Progress Note   07/20/22 1421  Mobility  Activity Ambulated with assistance in hallway  Level of Assistance Standby assist, set-up cues, supervision of patient - no hands on  Assistive Device Front wheel walker  Distance Ambulated (ft) 120 ft  Activity Response Tolerated well  Mobility Referral Yes  $Mobility charge 1 Mobility  Mobility Specialist Start Time (ACUTE ONLY) 0201  Mobility Specialist Stop Time (ACUTE ONLY) 0219  Mobility Specialist Time Calculation (min) (ACUTE ONLY) 18 min   Pt received in bed and agreeable to mobility. No complaints during session. Pt took 1x seated rest break due to feeling weak in legs. Pt to EOB after session with all needs met.    [facility]

## 2022-07-20 NOTE — TOC Progression Note (Addendum)
Transition of Care [facility]) - Progression Note    Patient Details  Name: Joseph Ruiz MRN: [id number] Date of Birth: 1975-11-20  [facility]) CM/SW Contact  Larrie Kass, LCSW Phone Number: 07/20/2022, 9:36 AM  Clinical Narrative:    Beds presented, pt has chosen [facility],  pt's PASRR still pending. CSW to start insurance auth. TOC to follow.  Adden Pt's PASRR # was assigned [id number]. Pt's insurance auth pending.TOC to follow  1:51pm Pt's Berkley Harvey was approved for SNF placement at [facility]. TOC to follow.        Expected Discharge Plan and Services                                               Social Determinants of Health (SDOH) Interventions SDOH Screenings   Food Insecurity: No Food Insecurity (07/16/2022)  Housing: Low Risk  (07/16/2022)  Transportation Needs: No Transportation Needs (07/16/2022)  Utilities: Not At Risk (07/16/2022)  Tobacco Use: High Risk (07/16/2022)    Readmission Risk Interventions     No data to display

## 2022-07-20 NOTE — Discharge Summary (Signed)
Physician Discharge Summary  Joseph Ruiz ZOX:[id number] DOB: 05-22-1975 DOA: 07/16/2022  PCP: Shelle Iron, MD  Admit date: 07/16/2022 Discharge date: 07/20/2022  Admitted From: Home Disposition: SNF  Recommendations for Outpatient Follow-up:  Follow up with SNF provider at earliest convenience Follow up in ED if symptoms worsen or new appear   Home Health: No Equipment/Devices: None  Discharge Condition: Stable CODE STATUS: Full Diet recommendation: Heart healthy  Brief/Interim Summary: 47 year old male with medical history of hypertension, chronic pain from degenerative disc disease, COPD presented with epigastric and right-sided flank pain along with chills and watery diarrhea.  On presentation, he was hypotensive and was found to be dehydrated with acute kidney injury.  He was started on IV fluids and IV antibiotics.  Subsequently, his kidney function has improved and he has been switched to oral cefadroxil for E. coli UTI. PT recommended SNF placement.  He is medically stable for discharge to SNF.   Discharge Diagnoses:   Acute kidney injury -Possibly from dehydration and hypotension -Renal function has normalized with IV fluids.  Creatinine 0.82 on 07/18/2022. -Diuretics and ACE inhibitors on hold.  -Discharge patient today to SNF   E. coli UTI: Present on admission -Initially started on IV Rocephin but has been changed to oral cefadroxil to complete course. Continue Cefadroxil for 2 more days on discharge   Normocytic anemia -Questionable cause.  Hemoglobin stable.  Monitor intermittently.   Hypertension -Blood pressure stable.  Antihypertensives on hold for now.  Patient has history of noncompliance and was not taking antihypertensives at home.   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  PT recommending SNF placement.  TOC  following.   Obesity -Outpatient follow-up   Discharge Instructions  Discharge Instructions     Diet - low sodium heart healthy   Complete by: As directed    Increase activity slowly   Complete by: As directed       Allergies as of 07/20/2022   No Known Allergies      Medication List     STOP taking these medications    amLODipine 5 MG tablet Commonly known as: NORVASC   atenolol 25 MG tablet Commonly known as: TENORMIN   atenolol 50 MG tablet Commonly known as: TENORMIN   BC HEADACHE POWDER PO   Buprenorphine HCl-Naloxone HCl 8-2 MG Film   fluticasone 50 MCG/ACT nasal spray Commonly known as: FLONASE   lisinopril-hydrochlorothiazide 20-25 MG tablet Commonly known as: ZESTORETIC   mupirocin ointment 2 % Commonly known as: BACTROBAN   naloxone 4 MG/0.1ML Liqd nasal spray kit Commonly known as: NARCAN   OLANZapine 5 MG tablet Commonly known as: ZYPREXA   oxyCODONE-acetaminophen 5-325 MG tablet Commonly known as: PERCOCET/ROXICET   pregabalin 150 MG capsule Commonly known as: LYRICA   sertraline 50 MG tablet Commonly known as: ZOLOFT   telmisartan 20 MG tablet Commonly known as: MICARDIS   traMADol 50 MG tablet Commonly known as: ULTRAM   traZODone 50 MG tablet Commonly known as: DESYREL       TAKE these medications    albuterol 108 (90 Base) MCG/ACT inhaler Commonly known as: VENTOLIN HFA Inhale 2 puffs into the lungs every 4 (four) hours as needed for wheezing or shortness of breath.   alum & mag hydroxide-simeth 200-200-20 MG/5ML suspension Commonly known as: MAALOX/MYLANTA Take 30 mLs by mouth every 4 (four) hours as needed for indigestion or heartburn.  aspirin EC 81 MG tablet Take 1 tablet (81 mg total) by mouth daily.   cefadroxil 500 MG capsule Commonly known as: DURICEF Take 2 capsules (1,000 mg total) by mouth 2 (two) times daily for 2 days.   gabapentin 300 MG capsule Commonly known as: NEURONTIN Take 300 mg by  mouth 3 (three) times daily.   ondansetron 4 MG disintegrating tablet Commonly known as: ZOFRAN-ODT Take 4 mg by mouth every 8 (eight) hours as needed for nausea.   pantoprazole 40 MG tablet Commonly known as: PROTONIX Take 1 tablet (40 mg total) by mouth daily. Start taking on: July 21, 2022        Contact information for after-discharge care     Destination     [facility] .   Service: Skilled Nursing Contact information: [address] [place] [address] [phone]                    No Known Allergies  Consultations: None   Procedures/Studies: CT ABDOMEN PELVIS WO CONTRAST  Result Date: 07/16/2022 CLINICAL DATA:  Abdominal and flank pain, right-sided flank pain for 4 days, history of stones EXAM: CT ABDOMEN AND PELVIS WITHOUT CONTRAST TECHNIQUE: Multidetector CT imaging of the abdomen and pelvis was performed following the standard protocol without IV contrast. RADIATION DOSE REDUCTION: This exam was performed according to the departmental dose-optimization program which includes automated exposure control, adjustment of the mA and/or kV according to patient size and/or use of iterative reconstruction technique. COMPARISON:  04/02/2022 FINDINGS: Lower chest: No acute abnormality. Hepatobiliary: No focal liver abnormality is seen. Status post cholecystectomy. No biliary dilatation. Pancreas: Unremarkable. No pancreatic ductal dilatation or surrounding inflammatory changes. Spleen: Normal in size without significant abnormality. Adrenals/Urinary Tract: Adrenal glands are unremarkable. Kidneys are normal, without renal calculi, solid lesion, or hydronephrosis. Bladder is unremarkable. Stomach/Bowel: Stomach is within normal limits. Appendix appears normal. No evidence of bowel wall thickening, distention, or inflammatory changes. Vascular/Lymphatic: Aortic atherosclerosis. No enlarged abdominal or pelvic lymph nodes.  Reproductive: No mass or other significant abnormality. Other: Small, fat containing left inguinal hernia.  No ascites. Musculoskeletal: No acute or significant osseous findings. IMPRESSION: 1. No acute noncontrast CT findings of the abdomen or pelvis to explain right-sided flank pain. No urinary tract calculi or hydronephrosis. 2. Status post cholecystectomy. 3. Small, fat containing left inguinal hernia. Aortic Atherosclerosis (ICD10-I70.0). Electronically Signed   By: Jearld Lesch M.D.   On: 07/16/2022 09:34   CT Head Wo Contrast  Result Date: 07/16/2022 CLINICAL DATA:  Moderate to severe head trauma EXAM: CT HEAD WITHOUT CONTRAST CT CERVICAL SPINE WITHOUT CONTRAST TECHNIQUE: Multidetector CT imaging of the head and cervical spine was performed following the standard protocol without intravenous contrast. Multiplanar CT image reconstructions of the cervical spine were also generated. RADIATION DOSE REDUCTION: This exam was performed according to the departmental dose-optimization program which includes automated exposure control, adjustment of the mA and/or kV according to patient size and/or use of iterative reconstruction technique. COMPARISON:  11/05/2019 FINDINGS: CT HEAD FINDINGS Brain: No evidence of acute infarction, hemorrhage, hydrocephalus, extra-axial collection or mass lesion/mass effect. Vascular: No hyperdense vessel or unexpected calcification. Skull: Normal. Negative for fracture or focal lesion. Sinuses/Orbits: No acute finding. CT CERVICAL SPINE FINDINGS Alignment: Normal. Skull base and vertebrae: No acute fracture. No primary bone lesion or focal pathologic process. Soft tissues and spinal canal: No prevertebral fluid or swelling. No visible canal hematoma. Disc levels: Disc space narrowing with mild  ridging. Early posterior longitudinal ligament ossification. Upper chest: Negative IMPRESSION: No evidence of acute intracranial or cervical spine injury. Electronically Signed   By: Tiburcio Pea M.D.   On: 07/16/2022 09:28   CT Cervical Spine Wo Contrast  Result Date: 07/16/2022 CLINICAL DATA:  Moderate to severe head trauma EXAM: CT HEAD WITHOUT CONTRAST CT CERVICAL SPINE WITHOUT CONTRAST TECHNIQUE: Multidetector CT imaging of the head and cervical spine was performed following the standard protocol without intravenous contrast. Multiplanar CT image reconstructions of the cervical spine were also generated. RADIATION DOSE REDUCTION: This exam was performed according to the departmental dose-optimization program which includes automated exposure control, adjustment of the mA and/or kV according to patient size and/or use of iterative reconstruction technique. COMPARISON:  11/05/2019 FINDINGS: CT HEAD FINDINGS Brain: No evidence of acute infarction, hemorrhage, hydrocephalus, extra-axial collection or mass lesion/mass effect. Vascular: No hyperdense vessel or unexpected calcification. Skull: Normal. Negative for fracture or focal lesion. Sinuses/Orbits: No acute finding. CT CERVICAL SPINE FINDINGS Alignment: Normal. Skull base and vertebrae: No acute fracture. No primary bone lesion or focal pathologic process. Soft tissues and spinal canal: No prevertebral fluid or swelling. No visible canal hematoma. Disc levels: Disc space narrowing with mild ridging. Early posterior longitudinal ligament ossification. Upper chest: Negative IMPRESSION: No evidence of acute intracranial or cervical spine injury. Electronically Signed   By: Tiburcio Pea M.D.   On: 07/16/2022 09:28   US Abdomen Limited  Result Date: 07/16/2022 CLINICAL DATA:  Right upper quadrant pain for 1 week, history of cholecystectomy EXAM: ULTRASOUND ABDOMEN LIMITED RIGHT UPPER QUADRANT COMPARISON:  None Available. FINDINGS: Gallbladder: Status post cholecystectomy.  No sonographic Murphy sign. Common bile duct: Diameter: 0.4 cm Liver: No focal lesion identified. Slightly increased parenchymal echogenicity. Portal vein is patent on  color Doppler imaging with normal direction of blood flow towards the liver. Other: None. IMPRESSION: 1. Status post cholecystectomy. No biliary ductal dilation. 2. Hepatic steatosis. Electronically Signed   By: Jearld Lesch M.D.   On: 07/16/2022 09:24   DG Chest Portable 1 View  Result Date: 07/16/2022 CLINICAL DATA:  Shortness of breath and syncope. EXAM: PORTABLE CHEST 1 VIEW COMPARISON:  05/07/2022 FINDINGS: The lungs are clear without focal pneumonia, edema, pneumothorax or pleural effusion. The cardiopericardial silhouette is within normal limits for size. No acute bony abnormality. Telemetry leads overlie the chest. IMPRESSION: No active disease. Electronically Signed   By: Kennith Center M.D.   On: 07/16/2022 07:05      Subjective: Patient seen and examined at bedside.  Had dry heaving and stomach upset last night. No vomiting, fever or chest pain reported.    Discharge Exam: Vitals:   07/20/22 0554 07/20/22 1347  BP: 130/80 129/70  Pulse: 65 83  Resp: 17 (!) 21  Temp: 98 F (36.7 C) 98.5 F (36.9 C)  SpO2: 100% 100%    General: Currently on room air.  No distress.  respiratory: Decreased breath sounds at bases bilaterally with some crackles CVS: Currently rate controlled; S1-S2 heard  abdominal: Soft, nontender, slightly distended, no organomegaly; bowel sounds are heard  extremities: Trace lower extremity edema; no clubbing.      The results of significant diagnostics from this hospitalization (including imaging, microbiology, ancillary and laboratory) are listed below for reference.     Microbiology: Recent Results (from the past 240 hour(s))  Urine Culture     Status: Abnormal   Collection Time: 07/16/22  8:20 AM   Specimen: Urine, Clean Catch  Result Value Ref  Range Status   Specimen Description   Final    URINE, CLEAN CATCH Performed at [facility], [address], [place], [place] [address]    Special Requests   Final    NONE Performed  at [facility], [address], [place], [place] [address]    Culture >=100,000 COLONIES/mL ESCHERICHIA COLI (A)  Final   Report Status 07/18/2022 FINAL  Final   Organism ID, Bacteria ESCHERICHIA COLI (A)  Final      Susceptibility   Escherichia coli - MIC*    AMPICILLIN 8 SENSITIVE Sensitive     CEFAZOLIN <=4 SENSITIVE Sensitive     CEFEPIME <=0.12 SENSITIVE Sensitive     CEFTRIAXONE <=0.25 SENSITIVE Sensitive     CIPROFLOXACIN <=0.25 SENSITIVE Sensitive     GENTAMICIN <=1 SENSITIVE Sensitive     IMIPENEM <=0.25 SENSITIVE Sensitive     NITROFURANTOIN <=16 SENSITIVE Sensitive     TRIMETH/SULFA <=20 SENSITIVE Sensitive     AMPICILLIN/SULBACTAM <=2 SENSITIVE Sensitive     PIP/TAZO <=4 SENSITIVE Sensitive     * >=100,000 COLONIES/mL ESCHERICHIA COLI     Labs: BNP (last 3 results) No results for input(s): "BNP" in the last 8760 hours. Basic Metabolic Panel: Recent Labs  Lab 07/16/22 0639 07/16/22 1237 07/17/22 0354 07/18/22 0416  NA 135 137 139 140  K 3.5 3.6 4.2 4.2  CL 100 108 111 112*  CO2 20* 18* 18* 21*  GLUCOSE 118* 119* 93 91  BUN 46* 47* 37* 24*  CREATININE 4.62* 2.93* 1.00 0.82  CALCIUM 9.0 8.1* 8.1* 8.2*   Liver Function Tests: Recent Labs  Lab 07/16/22 0639 07/18/22 0416  AST 25 15  ALT 25 15  ALKPHOS 90 68  BILITOT 0.3 0.5  PROT 8.7* 5.5*  ALBUMIN 4.2 2.7*   Recent Labs  Lab 07/16/22 0639  LIPASE 54*   No results for input(s): "AMMONIA" in the last 168 hours. CBC: Recent Labs  Lab 07/16/22 0639 07/17/22 0354 07/18/22 0416  WBC 15.6* 8.2 7.7  NEUTROABS 10.5*  --   --   HGB 13.8 10.8* 10.2*  HCT 42.6 34.0* 32.1*  MCV 88.0 90.7 91.2  PLT 365 260 240   Cardiac Enzymes: Recent Labs  Lab 07/16/22 0827  CKTOTAL 36*   BNP: Invalid input(s): "POCBNP" CBG: No results for input(s): "GLUCAP" in the last 168 hours. D-Dimer No results for input(s): "DDIMER" in the last 72 hours. Hgb A1c No results for input(s): "HGBA1C"  in the last 72 hours. Lipid Profile No results for input(s): "CHOL", "HDL", "LDLCALC", "TRIG", "CHOLHDL", "LDLDIRECT" in the last 72 hours. Thyroid function studies No results for input(s): "TSH", "T4TOTAL", "T3FREE", "THYROIDAB" in the last 72 hours.  Invalid input(s): "FREET3" Anemia work up No results for input(s): "VITAMINB12", "FOLATE", "FERRITIN", "TIBC", "IRON", "RETICCTPCT" in the last 72 hours. Urinalysis    Component Value Date/Time   COLORURINE YELLOW 07/16/2022 0820   APPEARANCEUR CLOUDY (A) 07/16/2022 0820   LABSPEC 1.024 07/16/2022 0820   PHURINE 5.0 07/16/2022 0820   GLUCOSEU NEGATIVE 07/16/2022 0820   HGBUR SMALL (A) 07/16/2022 0820   BILIRUBINUR NEGATIVE 07/16/2022 0820   KETONESUR NEGATIVE 07/16/2022 0820   PROTEINUR 100 (A) 07/16/2022 0820   UROBILINOGEN 0.2 09/21/2013 1201   NITRITE NEGATIVE 07/16/2022 0820   LEUKOCYTESUR LARGE (A) 07/16/2022 0820   Sepsis Labs Recent Labs  Lab 07/16/22 0639 07/17/22 0354 07/18/22 0416  WBC 15.6* 8.2 7.7   Microbiology Recent Results (from the past 240 hour(s))  Urine Culture     Status: Abnormal   Collection Time: 07/16/22  8:20 AM   Specimen: Urine, Clean Catch  Result Value Ref Range Status   Specimen Description   Final    URINE, CLEAN CATCH Performed at [facility], [address] [place], [place] [address]    Special Requests   Final    NONE Performed at [facility], [address], [place], [place] [address]    Culture >=100,000 COLONIES/mL ESCHERICHIA COLI (A)  Final   Report Status 07/18/2022 FINAL  Final   Organism ID, Bacteria ESCHERICHIA COLI (A)  Final      Susceptibility   Escherichia coli - MIC*    AMPICILLIN 8 SENSITIVE Sensitive     CEFAZOLIN <=4 SENSITIVE Sensitive     CEFEPIME <=0.12 SENSITIVE Sensitive     CEFTRIAXONE <=0.25 SENSITIVE Sensitive     CIPROFLOXACIN <=0.25 SENSITIVE Sensitive     GENTAMICIN <=1 SENSITIVE Sensitive     IMIPENEM <=0.25  SENSITIVE Sensitive     NITROFURANTOIN <=16 SENSITIVE Sensitive     TRIMETH/SULFA <=20 SENSITIVE Sensitive     AMPICILLIN/SULBACTAM <=2 SENSITIVE Sensitive     PIP/TAZO <=4 SENSITIVE Sensitive     * >=100,000 COLONIES/mL ESCHERICHIA COLI     Time coordinating discharge: 35 minutes  SIGNED:   Glade Lloyd, MD  [facility] 07/20/2022, 2:05 PM

## 2022-07-20 NOTE — Progress Notes (Signed)
PROGRESS NOTE    Joseph Ruiz  RJJ:[id number] DOB: 20-Jun-1975 DOA: 07/16/2022 PCP: Shelle Iron, MD   Brief Narrative:  47 year old male with medical history of hypertension, chronic pain from degenerative disc disease, COPD presented with epigastric and right-sided flank pain along with chills and watery diarrhea.  On presentation, he was hypotensive and was found to be dehydrated with acute kidney injury.  He was started on IV fluids and IV antibiotics.  Subsequently, his kidney function has improved and he has been switched to oral cefadroxil for E. coli UTI. PT recommended SNF placement.  He is medically stable for discharge to SNF.  Assessment & Plan:   Acute kidney injury -Possibly from dehydration and hypotension -Renal function has normalized with IV fluids.  Creatinine 0.82 on 07/18/2022. -Diuretics and ACE inhibitors on hold  E. coli UTI: Present on admission -Initially started on IV Rocephin but has been changed to oral cefadroxil to complete course.  Normocytic anemia -Questionable cause.  Hemoglobin stable.  Monitor intermittently.  Hypertension -Blood pressure stable.  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  PT recommending SNF placement.  TOC following.  Obesity -Outpatient follow-up    DVT prophylaxis: Heparin subcutaneous Code Status: Full Family Communication: None at bedside Disposition Plan: Status is: Inpatient Remains inpatient appropriate because: Of severity of illness.  Need for SNF placement.  Currently medically stable for discharge  Consultants: None  Procedures: None  Antimicrobials:  Anti-infectives (From admission, onward)    Start     Dose/Rate Route Frequency Ordered Stop   07/19/22 1000  cefadroxil (DURICEF) capsule 500 mg  Status:  Discontinued        500  mg Oral 2 times daily 07/18/22 1528 07/18/22 1534   07/19/22 1000  cefadroxil (DURICEF) capsule 1,000 mg        1,000 mg Oral 2 times daily 07/18/22 1534 07/21/22 0959   07/17/22 1000  cefTRIAXone (ROCEPHIN) 2 g in sodium chloride 0.9 % 100 mL IVPB  Status:  Discontinued        2 g 200 mL/hr over 30 Minutes Intravenous Every 24 hours 07/16/22 1044 07/18/22 1528   07/16/22 0915  cefTRIAXone (ROCEPHIN) 2 g in sodium chloride 0.9 % 100 mL IVPB        2 g 200 mL/hr over 30 Minutes Intravenous  Once 07/16/22 0909 07/16/22 1032         Subjective: Patient seen and examined at bedside.  Had dry heaving and stomach upset last night. No vomiting, fever or chest pain reported.   Objective: Vitals:   07/19/22 0429 07/19/22 1205 07/19/22 2252 07/20/22 0554  BP: 125/73 (!) 144/82 (!) 158/80 130/80  Pulse: 63 71 66 65  Resp: 17 16 17 17   Temp: 98.2 F (36.8 C) 98.5 F (36.9 C) 99.1 F (37.3 C) 98 F (36.7 C)  TempSrc: Oral Oral Oral Oral  SpO2: 100% 100% 100% 100%  Weight:      Height:        Intake/Output Summary (Last 24 hours) at 07/20/2022 0719 Last data filed at 07/20/2022 0300 Gross per 24 hour  Intake 840 ml  Output 3050 ml  Net -2210 ml    Filed Weights   07/16/22 0608 07/16/22 1156  Weight: 113.4 kg 108.7 kg    Examination:  General: Currently on room air.  No distress.  respiratory: Decreased breath sounds at bases  bilaterally with some crackles CVS: Currently rate controlled; S1-S2 heard  abdominal: Soft, nontender, slightly distended, no organomegaly; bowel sounds are heard  extremities: Trace lower extremity edema; no clubbing.     Data Reviewed: I have personally reviewed following labs and imaging studies  CBC: Recent Labs  Lab 07/16/22 0639 07/17/22 0354 07/18/22 0416  WBC 15.6* 8.2 7.7  NEUTROABS 10.5*  --   --   HGB 13.8 10.8* 10.2*  HCT 42.6 34.0* 32.1*  MCV 88.0 90.7 91.2  PLT 365 260 240    Basic Metabolic Panel: Recent Labs  Lab  07/16/22 0639 07/16/22 1237 07/17/22 0354 07/18/22 0416  NA 135 137 139 140  K 3.5 3.6 4.2 4.2  CL 100 108 111 112*  CO2 20* 18* 18* 21*  GLUCOSE 118* 119* 93 91  BUN 46* 47* 37* 24*  CREATININE 4.62* 2.93* 1.00 0.82  CALCIUM 9.0 8.1* 8.1* 8.2*    GFR: Estimated Creatinine Clearance: 135.3 mL/min (by C-G formula based on SCr of 0.82 mg/dL). Liver Function Tests: Recent Labs  Lab 07/16/22 0639 07/18/22 0416  AST 25 15  ALT 25 15  ALKPHOS 90 68  BILITOT 0.3 0.5  PROT 8.7* 5.5*  ALBUMIN 4.2 2.7*    Recent Labs  Lab 07/16/22 0639  LIPASE 54*    No results for input(s): "AMMONIA" in the last 168 hours. Coagulation Profile: Recent Labs  Lab 07/16/22 0639  INR 1.0    Cardiac Enzymes: Recent Labs  Lab 07/16/22 0827  CKTOTAL 36*    BNP (last 3 results) No results for input(s): "PROBNP" in the last 8760 hours. HbA1C: No results for input(s): "HGBA1C" in the last 72 hours. CBG: No results for input(s): "GLUCAP" in the last 168 hours. Lipid Profile: No results for input(s): "CHOL", "HDL", "LDLCALC", "TRIG", "CHOLHDL", "LDLDIRECT" in the last 72 hours. Thyroid Function Tests: No results for input(s): "TSH", "T4TOTAL", "FREET4", "T3FREE", "THYROIDAB" in the last 72 hours. Anemia Panel: No results for input(s): "VITAMINB12", "FOLATE", "FERRITIN", "TIBC", "IRON", "RETICCTPCT" in the last 72 hours. Sepsis Labs: Recent Labs  Lab 07/16/22 4098  LATICACIDVEN 1.2     Recent Results (from the past 240 hour(s))  Urine Culture     Status: Abnormal   Collection Time: 07/16/22  8:20 AM   Specimen: Urine, Clean Catch  Result Value Ref Range Status   Specimen Description   Final    URINE, CLEAN CATCH Performed at [facility], [address], [place], [place] [address]    Special Requests   Final    NONE Performed at [facility], [address] [place], [place] [address]    Culture >=100,000 COLONIES/mL ESCHERICHIA COLI  (A)  Final   Report Status 07/18/2022 FINAL  Final   Organism ID, Bacteria ESCHERICHIA COLI (A)  Final      Susceptibility   Escherichia coli - MIC*    AMPICILLIN 8 SENSITIVE Sensitive     CEFAZOLIN <=4 SENSITIVE Sensitive     CEFEPIME <=0.12 SENSITIVE Sensitive     CEFTRIAXONE <=0.25 SENSITIVE Sensitive     CIPROFLOXACIN <=0.25 SENSITIVE Sensitive     GENTAMICIN <=1 SENSITIVE Sensitive     IMIPENEM <=0.25 SENSITIVE Sensitive     NITROFURANTOIN <=16 SENSITIVE Sensitive     TRIMETH/SULFA <=20 SENSITIVE Sensitive     AMPICILLIN/SULBACTAM <=2 SENSITIVE Sensitive     PIP/TAZO <=4 SENSITIVE Sensitive     * >=100,000 COLONIES/mL ESCHERICHIA COLI         Radiology  Studies: No results found.      Scheduled Meds:  aspirin EC  81 mg Oral Daily   cefadroxil  1,000 mg Oral BID   gabapentin  400 mg Oral TID   heparin  5,000 Units Subcutaneous Q8H   nicotine  21 mg Transdermal Daily   Continuous Infusions:        Glade Lloyd, MD [facility] 07/20/2022, 7:19 AM

## 2022-07-20 NOTE — TOC Transition Note (Signed)
Transition of Care [facility]) - CM/SW Discharge Note   Patient Details  Name: Joseph Ruiz MRN: [id number] Date of Birth: May 20, 1975  [facility] CM/SW Contact:  Larrie Kass, LCSW Phone Number: 07/20/2022, 2:12 PM   Clinical Narrative:    Pt to d/c to [facility], room assignment 106, call report [phone]. PTAR called. No further TOC needs TOC sign off.      Final next level of care: Skilled Nursing Facility Barriers to Discharge: No Barriers Identified   Patient Goals and CMS Choice CMS Medicare.gov Compare Post Acute Care list provided to:: Patient    Discharge Placement                Patient chooses bed at: [facility] Patient to be transferred to facility by: EMS   Patient and family notified of of transfer: 07/20/22  Discharge Plan and Services Additional resources added to the After Visit Summary for                                       Social Determinants of Health (SDOH) Interventions SDOH Screenings   Food Insecurity: No Food Insecurity (07/16/2022)  Housing: Low Risk  (07/16/2022)  Transportation Needs: No Transportation Needs (07/16/2022)  Utilities: Not At Risk (07/16/2022)  Tobacco Use: High Risk (07/16/2022)     Readmission Risk Interventions     No data to display

## 2022-07-20 NOTE — Progress Notes (Signed)
Mobility Specialist - Progress Note   07/20/22 1045  Mobility  Activity Ambulated with assistance in hallway  Level of Assistance Standby assist, set-up cues, supervision of patient - no hands on  Assistive Device Front wheel walker  Distance Ambulated (ft) 100 ft  Activity Response Tolerated well  Mobility Referral Yes  $Mobility charge 1 Mobility  Mobility Specialist Start Time (ACUTE ONLY) 1028  Mobility Specialist Stop Time (ACUTE ONLY) 1045  Mobility Specialist Time Calculation (min) (ACUTE ONLY) 17 min   Pt received in bathroom and agreeable to mobility. No complaints during session. Pt to bed after session with all needs met & nurse in room.   [facility]

## 2022-07-20 NOTE — Plan of Care (Signed)

## 2022-07-20 NOTE — Progress Notes (Signed)
Discharge paper given to the pt.Report called to the facility.Waiting for transportation.

## 2022-12-10 ENCOUNTER — Emergency Department (HOSPITAL_COMMUNITY)
Admission: EM | Admit: 2022-12-10 | Discharge: 2022-12-13 | Disposition: A | Discharge status: Home / Self Care | Payer: 59 | Attending: Emergency Medicine | Admitting: Emergency Medicine

## 2022-12-10 DIAGNOSIS — F1120 Opioid dependence, uncomplicated: Secondary | ICD-10-CM | POA: Insufficient documentation

## 2022-12-10 DIAGNOSIS — F332 Major depressive disorder, recurrent severe without psychotic features: Secondary | ICD-10-CM | POA: Diagnosis present

## 2022-12-10 DIAGNOSIS — J449 Chronic obstructive pulmonary disease, unspecified: Secondary | ICD-10-CM | POA: Insufficient documentation

## 2022-12-10 DIAGNOSIS — D72829 Elevated white blood cell count, unspecified: Secondary | ICD-10-CM | POA: Insufficient documentation

## 2022-12-10 DIAGNOSIS — Z7982 Long term (current) use of aspirin: Secondary | ICD-10-CM | POA: Insufficient documentation

## 2022-12-10 DIAGNOSIS — T402X2A Poisoning by other opioids, intentional self-harm, initial encounter: Secondary | ICD-10-CM | POA: Diagnosis not present

## 2022-12-10 DIAGNOSIS — T50902A Poisoning by unspecified drugs, medicaments and biological substances, intentional self-harm, initial encounter: Secondary | ICD-10-CM

## 2022-12-10 DIAGNOSIS — E119 Type 2 diabetes mellitus without complications: Secondary | ICD-10-CM | POA: Insufficient documentation

## 2022-12-10 DIAGNOSIS — F17210 Nicotine dependence, cigarettes, uncomplicated: Secondary | ICD-10-CM | POA: Diagnosis not present

## 2022-12-10 DIAGNOSIS — I10 Essential (primary) hypertension: Secondary | ICD-10-CM | POA: Insufficient documentation

## 2022-12-10 HISTORY — DX: Other psychoactive substance use, unspecified, uncomplicated: F19.90

## 2022-12-11 ENCOUNTER — Other Ambulatory Visit: Payer: Self-pay

## 2022-12-11 ENCOUNTER — Encounter (HOSPITAL_COMMUNITY): Payer: Self-pay

## 2022-12-11 DIAGNOSIS — F332 Major depressive disorder, recurrent severe without psychotic features: Secondary | ICD-10-CM | POA: Diagnosis present

## 2022-12-11 DIAGNOSIS — F1120 Opioid dependence, uncomplicated: Secondary | ICD-10-CM | POA: Diagnosis present

## 2022-12-11 LAB — CBC WITH DIFFERENTIAL/PLATELET
Abs Immature Granulocytes: 0.02 10*3/uL (ref 0.00–0.07)
Basophils Absolute: 0.1 10*3/uL (ref 0.0–0.1)
Basophils Relative: 1 %
Eosinophils Absolute: 0.2 10*3/uL (ref 0.0–0.5)
Eosinophils Relative: 2 %
HCT: 31.9 % — ABNORMAL LOW (ref 39.0–52.0)
Hemoglobin: 10 g/dL — ABNORMAL LOW (ref 13.0–17.0)
Immature Granulocytes: 0 %
Lymphocytes Relative: 22 %
Lymphs Abs: 2.5 10*3/uL (ref 0.7–4.0)
MCH: 27 pg (ref 26.0–34.0)
MCHC: 31.3 g/dL (ref 30.0–36.0)
MCV: 86.2 fL (ref 80.0–100.0)
Monocytes Absolute: 1 10*3/uL (ref 0.1–1.0)
Monocytes Relative: 8 %
Neutro Abs: 7.9 10*3/uL — ABNORMAL HIGH (ref 1.7–7.7)
Neutrophils Relative %: 67 %
Platelets: 348 10*3/uL (ref 150–400)
RBC: 3.7 MIL/uL — ABNORMAL LOW (ref 4.22–5.81)
RDW: 15.3 % (ref 11.5–15.5)
WBC: 11.8 10*3/uL — ABNORMAL HIGH (ref 4.0–10.5)
nRBC: 0 % (ref 0.0–0.2)

## 2022-12-11 LAB — RAPID URINE DRUG SCREEN, HOSP PERFORMED
Amphetamines: NOT DETECTED
Barbiturates: NOT DETECTED
Benzodiazepines: NOT DETECTED
Cocaine: NOT DETECTED
Opiates: NOT DETECTED
Tetrahydrocannabinol: POSITIVE — AB

## 2022-12-11 LAB — COMPREHENSIVE METABOLIC PANEL
ALT: 78 U/L — ABNORMAL HIGH (ref 0–44)
AST: 102 U/L — ABNORMAL HIGH (ref 15–41)
Albumin: 3.1 g/dL — ABNORMAL LOW (ref 3.5–5.0)
Alkaline Phosphatase: 102 U/L (ref 38–126)
Anion gap: 8 (ref 5–15)
BUN: 22 mg/dL — ABNORMAL HIGH (ref 6–20)
CO2: 25 mmol/L (ref 22–32)
Calcium: 8.3 mg/dL — ABNORMAL LOW (ref 8.9–10.3)
Chloride: 106 mmol/L (ref 98–111)
Creatinine, Ser: 0.75 mg/dL (ref 0.61–1.24)
GFR, Estimated: >60 mL/min (ref >60)
Glucose, Bld: 117 mg/dL — ABNORMAL HIGH (ref 70–99)
Potassium: 3.3 mmol/L — ABNORMAL LOW (ref 3.5–5.1)
Sodium: 139 mmol/L (ref 135–145)
Total Bilirubin: 0.2 mg/dL (ref <1.2)
Total Protein: 6.6 g/dL (ref 6.5–8.1)

## 2022-12-11 LAB — ETHANOL: Alcohol, Ethyl (B): <10 mg/dL (ref <10)

## 2022-12-11 LAB — CBG MONITORING, ED: Glucose-Capillary: 96 mg/dL (ref 70–99)

## 2022-12-11 LAB — SALICYLATE LEVEL: Salicylate Lvl: <7 mg/dL — ABNORMAL LOW (ref 7.0–30.0)

## 2022-12-11 LAB — LIPASE, BLOOD: Lipase: 40 U/L (ref 11–51)

## 2022-12-11 LAB — ACETAMINOPHEN LEVEL: Acetaminophen (Tylenol), Serum: <10 ug/mL — ABNORMAL LOW (ref 10–30)

## 2022-12-11 MED ORDER — AMLODIPINE BESYLATE 5 MG PO TABS
5.0000 mg | ORAL_TABLET | Freq: Every day | ORAL | Status: DC
  Administered 2022-12-11 – 2022-12-12 (×2): 5 mg via ORAL
  Filled 2022-12-11 (×2): qty 1

## 2022-12-11 MED ORDER — METHOCARBAMOL 500 MG PO TABS
500.0000 mg | ORAL_TABLET | Freq: Three times a day (TID) | ORAL | Status: DC | PRN
  Administered 2022-12-11: 500 mg via ORAL
  Filled 2022-12-11: qty 1

## 2022-12-11 MED ORDER — ALUM & MAG HYDROXIDE-SIMETH 200-200-20 MG/5ML PO SUSP
30.0000 mL | Freq: Four times a day (QID) | ORAL | Status: DC | PRN
  Administered 2022-12-11: 30 mL via ORAL
  Filled 2022-12-11: qty 30

## 2022-12-11 MED ORDER — ACETAMINOPHEN 325 MG PO TABS
650.0000 mg | ORAL_TABLET | Freq: Four times a day (QID) | ORAL | Status: DC | PRN
  Administered 2022-12-11 – 2022-12-12 (×3): 650 mg via ORAL
  Filled 2022-12-11 (×3): qty 2

## 2022-12-11 MED ORDER — ALBUTEROL SULFATE HFA 108 (90 BASE) MCG/ACT IN AERS: 2.0000 | INHALATION_SPRAY | RESPIRATORY_TRACT | Status: DC | PRN

## 2022-12-11 MED ORDER — PANTOPRAZOLE SODIUM 40 MG PO TBEC
40.0000 mg | DELAYED_RELEASE_TABLET | Freq: Every day | ORAL | Status: DC
  Administered 2022-12-11 – 2022-12-13 (×3): 40 mg via ORAL
  Filled 2022-12-11 (×4): qty 1

## 2022-12-11 MED ORDER — GABAPENTIN 300 MG PO CAPS
300.0000 mg | ORAL_CAPSULE | Freq: Three times a day (TID) | ORAL | Status: DC
  Administered 2022-12-11 – 2022-12-13 (×7): 300 mg via ORAL
  Filled 2022-12-11 (×7): qty 1

## 2022-12-11 MED ORDER — CLONIDINE HCL 0.1 MG PO TABS
0.1000 mg | ORAL_TABLET | Freq: Every day | ORAL | Status: DC
  Administered 2022-12-11 – 2022-12-13 (×3): 0.1 mg via ORAL
  Filled 2022-12-11 (×3): qty 1

## 2022-12-11 MED ORDER — CYCLOBENZAPRINE HCL 10 MG PO TABS
10.0000 mg | ORAL_TABLET | Freq: Once | ORAL | Status: AC
  Administered 2022-12-11: 10 mg via ORAL
  Filled 2022-12-11: qty 1

## 2022-12-11 MED ORDER — POTASSIUM CHLORIDE CRYS ER 20 MEQ PO TBCR
40.0000 meq | EXTENDED_RELEASE_TABLET | Freq: Once | ORAL | Status: AC
  Administered 2022-12-11: 40 meq via ORAL
  Filled 2022-12-11: qty 2

## 2022-12-11 MED ORDER — SERTRALINE HCL 50 MG PO TABS
25.0000 mg | ORAL_TABLET | Freq: Every day | ORAL | Status: DC
  Administered 2022-12-12: 25 mg via ORAL
  Filled 2022-12-11: qty 1

## 2022-12-11 MED ORDER — DROPERIDOL 2.5 MG/ML IJ SOLN
1.2500 mg | Freq: Once | INTRAMUSCULAR | Status: AC
  Administered 2022-12-11: 1.25 mg via INTRAMUSCULAR
  Filled 2022-12-11: qty 2

## 2022-12-11 MED ORDER — ONDANSETRON HCL 4 MG PO TABS
4.0000 mg | ORAL_TABLET | Freq: Three times a day (TID) | ORAL | Status: DC | PRN
  Administered 2022-12-11: 4 mg via ORAL
  Filled 2022-12-11 (×3): qty 1

## 2022-12-11 NOTE — ED Notes (Addendum)
Patient to room 31. Patient ambulated to room.  Patient with no s/s of distress at this time. Patient oriented to unit and room.

## 2022-12-11 NOTE — Consult Note (Signed)
[facility] ED ASSESSMENT   Reason for Consult:  Psychiatry Evaluation  Referring Physician:  ER Physician Patient Identification: Joseph Ruiz MRN:  [id number] ED Chief Complaint: Severe episode of recurrent major depressive disorder, without psychotic features (HCC)  Diagnosis:  Principal Problem:   Severe episode of recurrent major depressive disorder, without psychotic features (HCC) Active Problems:   Opiate dependence (HCC)   ED Assessment Time Calculation: Start Time: 1000 Stop Time: 1029 Total Time in Minutes (Assessment Completion): 29   Subjective:   Joseph Ruiz is a 47 y.o. male patient admitted with previous hx of Depression, substance abuse and accidental  Drug OD was brought in by EMS after he injected himself with excessive dose of Heroin as a suicide attempt.    HPI:  Patient was seen this morning and he presented a sad affect and reported feeling depressed.  Patient states that he is frustrated that he is unable to stop using Heroin and that he has been using for two years.  He admitted that usually he snorts $10 worth of Heroin daily but yesterday he injected $60 worth as a suicide attempt.  His stressors is his inability to stop using any form of illegal drugs.  Patient states the he tries to stop using but the discomfort of withdrawing from Heroin makes him go back.  He states that he is ready to stop but it has to be a  safe environment where he will not relapse due to the discomfort he usually feels.  Patient admits to feeling depressed and rated depression 6-7/10 with 10 being severe depression.  Patient states he was on Zoloft in the past and topped for five years because he has been using illicit drugs to self Medications.  He was addicted to [employer] but quit for three months now.  Patient is interested in proper treatment for Depression and states he is interested in Rehab treatment as well. Male, 47 years old who appears older than stated age seen for  intentional OD with Heroin as a suicide attempt.  Patient states that he is angry and depressed that he is unable to stop using drugs.  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  Patient has hx of previous accidental Drug OD and Drug induced Hallucinations.  We will start him on Medications to ease his withdrawal discomfort.  We will seek inpatient Psychiatry hospitalization to treat depression.  Goal is to stabilize patient and encourage outpatient substance abuse treatment or long term substance abuse rehabilitation.  Patient denies SI/HI/AVH.Marland Kitchen  Past Psychiatric History: hx of Depression, substance abuse and intentional Drug OD.  No Psychotropic medications for five years and have not seen a Psychiatrist in five years.  Risk to Self or Others: Is the patient at risk to self? Yes Has the patient been a risk to self in the past 6 months? Yes Has the patient been a risk to self within the distant past? Yes Is the patient a risk to others? No Has the patient been a risk to others in the past 6 months? No Has the patient been a risk to others within the distant past? No  [place] Scale:  Flowsheet Row ED from 12/10/2022 in [facility] Emergency Department at [facility] ED to Hosp-Admission (Discharged) from 07/16/2022 in [facility] LONG 4TH FLOOR PROGRESSIVE CARE AND UROLOGY ED from 04/04/2022 in [facility]  C-SSRS RISK CATEGORY High Risk No Risk No Risk  AIMS:  , , ,  ,   ASAM:    Substance Abuse:     Past Medical History:  Past Medical History:  Diagnosis Date   COPD (chronic obstructive pulmonary disease) (HCC)    DDD (degenerative disc disease), lumbar    Diabetes mellitus without complication (HCC)    DVT (deep venous thrombosis) (HCC)    Hypertension    IV drug user    Heroin   Kidney stone     Past Surgical History:  Procedure Laterality Date   BACK SURGERY     CHOLECYSTECTOMY     GALLBLADDER SURGERY      KIDNEY SURGERY  2010   Family History:  Family History  Problem Relation Age of Onset   Hypertension Father    COPD Father    Diabetes Mother    Hypertension Mother    Hypertension Sister    Family Psychiatric  History: denies Social History:  Social History   Substance and Sexual Activity  Alcohol Use No     Social History   Substance and Sexual Activity  Drug Use Yes   Types: IV   Comment: Heroin "$10 worth a day"    Social History   Socioeconomic History   Marital status: Single    Spouse name: Not on file   Number of children: Not on file   Years of education: Not on file   Highest education level: Not on file  Occupational History   Not on file  Tobacco Use   Smoking status: Every Day    Current packs/day: 1.00    Types: Cigarettes    Passive exposure: Current   Smokeless tobacco: Never  Vaping Use   Vaping status: Some Days  Substance and Sexual Activity   Alcohol use: No   Drug use: Yes    Types: IV    Comment: Heroin "$10 worth a day"   Sexual activity: Not Currently  Other Topics Concern   Not on file  Social History Narrative   Not on file   Social Determinants of Health   Financial Resource Strain: Not on File (02/07/2022)   Received from [employer] [employer]    Financial Resource Strain: 0  Food Insecurity: Not on File (10/19/2022)   Received from [facility] Insecurity    Food: 0  Transportation Needs: No Transportation Needs (07/16/2022)   PRAPARE - Administrator, Civil Service (Medical): No    Lack of Transportation (Non-Medical): No  Physical Activity: Not on File (02/07/2022)   Received from [place], [place]   Physical Activity    Physical Activity: 0  Stress: Not on File (02/07/2022)   Received from [facility], [place]   Stress    Stress: 0  Social Connections: Not on File (10/07/2022)   Received from [facility]   Social Connections    Connectedness: 0   Additional Social History:    Allergies:    Allergies  Allergen Reactions   Codeine Hives    Labs:  Results for orders placed or performed during the hospital encounter of 12/10/22 (from the past 48 hour(s))  CBG monitoring, ED     Status: None   Collection Time: 12/11/22 12:50 AM  Result Value Ref Range   Glucose-Capillary 96 70 - 99 mg/dL    Comment: Glucose reference range applies only to samples taken after fasting for at least 8 hours.  Urine rapid drug screen (hosp performed)     Status: Abnormal  Collection Time: 12/11/22  1:09 AM  Result Value Ref Range   Opiates NONE DETECTED NONE DETECTED   Cocaine NONE DETECTED NONE DETECTED   Benzodiazepines NONE DETECTED NONE DETECTED   Amphetamines NONE DETECTED NONE DETECTED   Tetrahydrocannabinol POSITIVE (A) NONE DETECTED   Barbiturates NONE DETECTED NONE DETECTED    Comment: (NOTE) DRUG SCREEN FOR MEDICAL PURPOSES ONLY.  IF CONFIRMATION IS NEEDED FOR ANY PURPOSE, NOTIFY LAB WITHIN 5 DAYS.  LOWEST DETECTABLE LIMITS FOR URINE DRUG SCREEN Drug Class                     Cutoff (ng/mL) Amphetamine and metabolites    1000 Barbiturate and metabolites    200 Benzodiazepine                 200 Opiates and metabolites        300 Cocaine and metabolites        300 THC                            50 Performed at [facility], [address], [place], [place] [address]   Comprehensive metabolic panel     Status: Abnormal   Collection Time: 12/11/22  1:44 AM  Result Value Ref Range   Sodium 139 135 - 145 mmol/L   Potassium 3.3 (L) 3.5 - 5.1 mmol/L   Chloride 106 98 - 111 mmol/L   CO2 25 22 - 32 mmol/L   Glucose, Bld 117 (H) 70 - 99 mg/dL    Comment: Glucose reference range applies only to samples taken after fasting for at least 8 hours.   BUN 22 (H) 6 - 20 mg/dL   Creatinine, Ser 2.95 0.61 - 1.24 mg/dL   Calcium 8.3 (L) 8.9 - 10.3 mg/dL   Total Protein 6.6 6.5 - 8.1 g/dL   Albumin 3.1 (L) 3.5 - 5.0 g/dL   AST 621 (H) 15 - 41 U/L   ALT 78 (H) 0 -  44 U/L   Alkaline Phosphatase 102 38 - 126 U/L   Total Bilirubin 0.2 <1.2 mg/dL   GFR, Estimated >30 >86 mL/min    Comment: (NOTE) Calculated using the CKD-EPI Creatinine Equation (2021)    Anion gap 8 5 - 15    Comment: Performed at [facility], [address] [place], [place] [address]  Salicylate level     Status: Abnormal   Collection Time: 12/11/22  1:44 AM  Result Value Ref Range   Salicylate Lvl <7.0 (L) 7.0 - 30.0 mg/dL    Comment: Performed at [facility], [address], [place], [place] [address]  Acetaminophen level     Status: Abnormal   Collection Time: 12/11/22  1:44 AM  Result Value Ref Range   Acetaminophen (Tylenol), Serum <10 (L) 10 - 30 ug/mL    Comment: (NOTE) Therapeutic concentrations vary significantly. A range of 10-30 ug/mL  may be an effective concentration for many patients. However, some  are best treated at concentrations outside of this range. Acetaminophen concentrations >150 ug/mL at 4 hours after ingestion  and >50 ug/mL at 12 hours after ingestion are often associated with  toxic reactions.  Performed at [facility], [address] [place], [place] [address]   Ethanol     Status: None   Collection Time: 12/11/22  1:44 AM  Result Value Ref Range   Alcohol, Ethyl (B) <10 <  10 mg/dL    Comment: (NOTE) Lowest detectable limit for serum alcohol is 10 mg/dL.  For medical purposes only. Performed at [facility], [address] [place], [place] [address]   CBC WITH DIFFERENTIAL     Status: Abnormal   Collection Time: 12/11/22  1:44 AM  Result Value Ref Range   WBC 11.8 (H) 4.0 - 10.5 K/uL   RBC 3.70 (L) 4.22 - 5.81 MIL/uL   Hemoglobin 10.0 (L) 13.0 - 17.0 g/dL   HCT 52.8 (L) 41.3 - 24.4 %   MCV 86.2 80.0 - 100.0 fL   MCH 27.0 26.0 - 34.0 pg   MCHC 31.3 30.0 - 36.0 g/dL   RDW 01.0 27.2 - 53.6 %   Platelets 348 150 - 400 K/uL   nRBC 0.0 0.0 - 0.2 %    Neutrophils Relative % 67 %   Neutro Abs 7.9 (H) 1.7 - 7.7 K/uL   Lymphocytes Relative 22 %   Lymphs Abs 2.5 0.7 - 4.0 K/uL   Monocytes Relative 8 %   Monocytes Absolute 1.0 0.1 - 1.0 K/uL   Eosinophils Relative 2 %   Eosinophils Absolute 0.2 0.0 - 0.5 K/uL   Basophils Relative 1 %   Basophils Absolute 0.1 0.0 - 0.1 K/uL   Immature Granulocytes 0 %   Abs Immature Granulocytes 0.02 0.00 - 0.07 K/uL    Comment: Performed at [facility], [address], [place], [place] [address]    Current Facility-Administered Medications  Medication Dose Route Frequency Provider Last Rate Last Admin   albuterol (VENTOLIN HFA) 108 (90 Base) MCG/ACT inhaler 2 puff  2 puff Inhalation Q4H PRN Palumbo, April, MD       alum & mag hydroxide-simeth (MAALOX/MYLANTA) 200-200-20 MG/5ML suspension 30 mL  30 mL Oral Q6H PRN Palumbo, April, MD       cloNIDine (CATAPRES) tablet 0.1 mg  0.1 mg Oral Daily Alvira Monday, MD   0.1 mg at 12/11/22 0941   gabapentin (NEURONTIN) capsule 300 mg  300 mg Oral TID Dahlia Byes C, NP   300 mg at 12/11/22 0916   methocarbamol (ROBAXIN) tablet 500 mg  500 mg Oral Q8H PRN Dahlia Byes C, NP   500 mg at 12/11/22 0916   ondansetron (ZOFRAN) tablet 4 mg  4 mg Oral Q8H PRN Palumbo, April, MD       pantoprazole (PROTONIX) EC tablet 40 mg  40 mg Oral Daily Palumbo, April, MD   40 mg at 12/11/22 0915   [START ON 12/12/2022] sertraline (ZOLOFT) tablet 25 mg  25 mg Oral Daily Dahlia Byes C, NP       Current Outpatient Medications  Medication Sig Dispense Refill   cloNIDine (CATAPRES) 0.1 MG tablet Take 0.1 mg by mouth daily.     gabapentin (NEURONTIN) 300 MG capsule Take 300 mg by mouth 3 (three) times daily.     albuterol (VENTOLIN HFA) 108 (90 Base) MCG/ACT inhaler Inhale 2 puffs into the lungs every 4 (four) hours as needed for wheezing or shortness of breath. (Patient not taking: Reported on 07/16/2022)     alum & mag hydroxide-simeth  (MAALOX/MYLANTA) 200-200-20 MG/5ML suspension Take 30 mLs by mouth every 4 (four) hours as needed for indigestion or heartburn. (Patient not taking: Reported on 12/11/2022) 355 mL 0   aspirin EC 81 MG tablet Take 1 tablet (81 mg total) by mouth daily. (Patient not taking: Reported on 12/11/2022) 30 tablet 0   ondansetron (ZOFRAN-ODT) 4 MG disintegrating  tablet Take 4 mg by mouth every 8 (eight) hours as needed for nausea. (Patient not taking: Reported on 07/16/2022)     pantoprazole (PROTONIX) 40 MG tablet Take 1 tablet (40 mg total) by mouth daily. (Patient not taking: Reported on 12/11/2022) 30 tablet 0    Musculoskeletal: Strength & Muscle Tone: within normal limits Gait & Station: normal Patient leans: Front   Psychiatric Specialty Exam: Presentation  General Appearance:  Casual; Disheveled  Eye Contact: Fleeting  Speech: Clear and Coherent  Speech Volume: Normal  Handedness: Right   Mood and Affect  Mood: Anxious; Depressed; Hopeless  Affect: Congruent; Depressed   Thought Process  Thought Processes: Coherent  Descriptions of Associations:Intact  Orientation:Partial  Thought Content:Logical  History of Schizophrenia/Schizoaffective disorder:No data recorded Duration of Psychotic Symptoms:No data recorded Hallucinations:Hallucinations: None  Ideas of Reference:None  Suicidal Thoughts:Suicidal Thoughts: No  Homicidal Thoughts:Homicidal Thoughts: No   Sensorium  Memory: Immediate Good; Recent Good; Remote Good  Judgment: Fair  Insight: Present   Executive Functions  Concentration: Good  Attention Span: Good  Recall: Good  Fund of Knowledge: Good  Language: Good   Psychomotor Activity  Psychomotor Activity: Psychomotor Activity: Normal   Assets  Assets: Communication Skills; Desire for Improvement; Housing; Social Support    Sleep  Sleep: Sleep: Fair   Physical Exam: Physical Exam Vitals and nursing note  reviewed.  Constitutional:      Appearance: Normal appearance.  HENT:     Nose: Nose normal.  Cardiovascular:     Rate and Rhythm: Normal rate and regular rhythm.  Pulmonary:     Effort: Pulmonary effort is normal.  Musculoskeletal:        General: Normal range of motion.  Skin:    General: Skin is dry.     Comments: Multiple old scabs and scars and fresh open areas.  Neurological:     Mental Status: He is alert and oriented to person, place, and time.  Psychiatric:        Attention and Perception: Attention and perception normal.        Mood and Affect: Mood is anxious and depressed. Affect is angry.        Speech: Speech normal.        Behavior: Behavior normal. Behavior is cooperative.        Thought Content: Thought content normal.        Judgment: Judgment is impulsive.    Review of Systems  Constitutional: Negative.   HENT: Negative.    Eyes: Negative.   Respiratory: Negative.    Cardiovascular: Negative.   Gastrointestinal: Negative.   Genitourinary: Negative.   Musculoskeletal: Negative.   Skin:        Multiple scars, dry scabs and fresh open areas on his arm.  Neurological: Negative.   Endo/Heme/Allergies: Negative.   Psychiatric/Behavioral:  Positive for depression, substance abuse and suicidal ideas. The patient is nervous/anxious.    Blood pressure (!) 179/104, pulse 77, temperature 100 F (37.8 C), temperature source Oral, resp. rate 16, height 5\' 9"  (1.753 m), weight 117.9 kg, SpO2 99%. Body mass index is 38.4 kg/m.  Medical Decision Making: We will fax out records for bed placement at any facility with available bed.  We have started Gabapentin 300 mg po three times a day for muscle cramps.  We have started Robaxin 500 mg every 8 hrs as needed for pain and discomfort.   Clonidine 0.1 mg po daily for withdrawal symptoms and High blood Pressure. Zoloft 25 mg po  daily for depression-Plan to increase if tolerated. Disposition:  Admit, seek bed  placement.  Earney Navy, NP-PMHNP-BC 12/11/2022 10:42 AM

## 2022-12-11 NOTE — BH Assessment (Addendum)
Disposition Note:   Per Dahlia Byes, NP, the patient meets the criteria for inpatient psychiatric treatment. [employer], RN, [employer], confirms that there is no bed availability at [facility] at this time. Clinician advised in referring the patient to alternative facilities for consideration of placement.The patient has been referred to the following facilities for bed placement consideration:  Service Provider Request Status Selected Services Address Phone Fax Patient Preferred  [facility]  Pending - Request Sent -- [address] [place] [address] [phone] --  [facility] Patient Placement  Pending - Request Sent -- [facility], [place] [phone] --  [facility]  Pending - Request Sent -- [address], [place] [address] [phone] --  [facility]  Pending - Request Sent -- [address] [place] [address] [phone] --  [facility]  Pending - Request Sent -- [address] [place] [address] [phone] --  [facility]  Pending - Request Sent -- [address], [place] [address] [phone] --  [facility]  Pending - Request Sent -- [address] [place], [place] [address] [phone] --  [facility]  Pending - Request Sent -- [address] [place], [place] [address] [phone] --  [facility]  Pending - Request Sent -- [address] [place] [address] [phone] --  [facility]  Pending - Request Sent -- [address] [place] [address] [phone] --  [facility]  Pending - Request Sent -- [address], [place] [address] [phone] --  [facility]  Pending  - Request Sent -- [phone] [place] [address] [phone] --  [facility]  Pending - Request Sent -- [address] [place] [address] [phone] --  [facility]  Pending - Request Sent -- [address] [place] [address] [phone] --  [facility]  Pending - Request Sent -- [address] [place] [phone] --  [facility]  Pending - Request Sent -- [address] [place] [address] [phone] --  [facility]  Pending - Request Sent -- [address] [place], [place] [address] [phone] --  [facility]  Pending - Request Sent -- [address] [place] [address] [phone] --  [facility]  Pending - Request Sent -- [address] [place], [place] [address] [phone] --  [facility]  Pending - Request Sent -- [address] [place] [address] [phone] --  [facility]  Pending - Request Sent -- [place] [phone] --  [facility]  Pending - Request Sent -- [address] [place] [address] [phone] --  [facility]  Pending - Request Sent -- [address] [place] [address] [phone] --  [facility]  Pending - Request Sent -- [address] [place] [address] [phone] --  [facility]  Pending - Request Sent -- [address] [place] [address] [phone] --  [facility]  Pending - No Request Sent -- [place] [phone] -- --

## 2022-12-11 NOTE — ED Triage Notes (Signed)
Pt arrives via [facility] from home with suicidal ideations. Pt reports IVDU with heroin states, "I normally shoot up $10 worth and today I shot up $60 worth trying to end my life." Pt reports recent relapse and states he has been making attempts to detox from heroin for the last 2-3 weeks and has been unable to do so d/t severe withdrawal symptoms after approx. 3 days. Pt reports extensive IDVU history and states he used to use crystal meth as well but states "my blood pressure was getting too high with that and I didn't like how it made me feel so I quit it." Pt states this is his second suicide attempt in last 6 months. Pt also reports c/o of epigastric abd pain. Pt hx of Hep C+.

## 2022-12-11 NOTE — ED Provider Notes (Signed)
[place] EMERGENCY DEPARTMENT AT [facility] Provider Note   CSN: [id number] Arrival date & time: 12/10/22  2348     History  Chief Complaint  Patient presents with   Suicide Attempt    Joseph Ruiz is a 47 y.o. male.  The history is provided by the patient.  Drug Overdose This is a new problem. The current episode started 12 to 24 hours ago. The problem occurs rarely. The problem has been resolved. Pertinent negatives include no chest pain, no abdominal pain, no headaches and no shortness of breath. Nothing relieves the symptoms. He has tried nothing for the symptoms. The treatment provided no relief.  Patient with a h/o IVDU presents having taken more heroin than usual to commit suicide.      Past Medical History:  Diagnosis Date   COPD (chronic obstructive pulmonary disease) (HCC)    DDD (degenerative disc disease), lumbar    Diabetes mellitus without complication (HCC)    DVT (deep venous thrombosis) (HCC)    Hypertension    IV drug user    Heroin   Kidney stone      Home Medications Prior to Admission medications   Medication Sig Start Date End Date Taking? Authorizing Provider  albuterol (VENTOLIN HFA) 108 (90 Base) MCG/ACT inhaler Inhale 2 puffs into the lungs every 4 (four) hours as needed for wheezing or shortness of breath. Patient not taking: Reported on 07/16/2022    [provider]  alum & mag hydroxide-simeth (MAALOX/MYLANTA) 200-200-20 MG/5ML suspension Take 30 mLs by mouth every 4 (four) hours as needed for indigestion or heartburn. 07/20/22   Glade Lloyd, MD  aspirin EC 81 MG tablet Take 1 tablet (81 mg total) by mouth daily. 05/10/17   Georgeanna Lea, MD  gabapentin (NEURONTIN) 300 MG capsule Take 300 mg by mouth 3 (three) times daily. 06/12/22   [provider]  ondansetron (ZOFRAN-ODT) 4 MG disintegrating tablet Take 4 mg by mouth every 8 (eight) hours as needed for nausea. Patient not taking: Reported on  07/16/2022 03/12/22   [provider]  pantoprazole (PROTONIX) 40 MG tablet Take 1 tablet (40 mg total) by mouth daily. 07/21/22   Glade Lloyd, MD      Allergies    Patient has no known allergies.    Review of Systems   Review of Systems  Respiratory:  Negative for shortness of breath.   Cardiovascular:  Negative for chest pain.  Gastrointestinal:  Negative for abdominal pain.  Musculoskeletal:  Negative for neck stiffness.  Neurological:  Negative for headaches.  Psychiatric/Behavioral:  Positive for suicidal ideas.   All other systems reviewed and are negative.   Physical Exam Updated Vital Signs BP (!) 172/92 (BP Location: Right Arm)   Pulse 86   Temp 100 F (37.8 C) (Oral)   Resp 17   Ht 5\' 9"  (1.753 m)   Wt 117.9 kg   SpO2 100%   BMI 38.40 kg/m  Physical Exam Vitals and nursing note reviewed.  Constitutional:      General: He is not in acute distress.    Appearance: Normal appearance. He is well-developed. He is not diaphoretic.  HENT:     Head: Normocephalic and atraumatic.     Nose: Nose normal.  Eyes:     Conjunctiva/sclera: Conjunctivae normal.     Pupils: Pupils are equal, round, and reactive to light.  Cardiovascular:     Rate and Rhythm: Normal rate and regular rhythm.     Pulses:  Normal pulses.     Heart sounds: Normal heart sounds.  Pulmonary:     Effort: Pulmonary effort is normal.     Breath sounds: Normal breath sounds. No wheezing or rales.  Abdominal:     General: Bowel sounds are normal.     Palpations: Abdomen is soft.     Tenderness: There is no abdominal tenderness. There is no guarding or rebound.  Musculoskeletal:        General: Normal range of motion.     Cervical back: Normal range of motion and neck supple.  Skin:    General: Skin is warm and dry.     Capillary Refill: Capillary refill takes less than 2 seconds.  Neurological:     General: No focal deficit present.     Mental Status: He is alert and oriented to person,  place, and time.     Deep Tendon Reflexes: Reflexes normal.  Psychiatric:        Thought Content: Thought content includes suicidal ideation. Thought content does not include homicidal ideation. Thought content does not include homicidal plan.     ED Results / Procedures / Treatments   Labs (all labs ordered are listed, but only abnormal results are displayed) Results for orders placed or performed during the hospital encounter of 12/10/22  Comprehensive metabolic panel  Result Value Ref Range   Sodium 139 135 - 145 mmol/L   Potassium 3.3 (L) 3.5 - 5.1 mmol/L   Chloride 106 98 - 111 mmol/L   CO2 25 22 - 32 mmol/L   Glucose, Bld 117 (H) 70 - 99 mg/dL   BUN 22 (H) 6 - 20 mg/dL   Creatinine, Ser 6.96 0.61 - 1.24 mg/dL   Calcium 8.3 (L) 8.9 - 10.3 mg/dL   Total Protein 6.6 6.5 - 8.1 g/dL   Albumin 3.1 (L) 3.5 - 5.0 g/dL   AST 295 (H) 15 - 41 U/L   ALT 78 (H) 0 - 44 U/L   Alkaline Phosphatase 102 38 - 126 U/L   Total Bilirubin 0.2 <1.2 mg/dL   GFR, Estimated >28 >41 mL/min   Anion gap 8 5 - 15  Salicylate level  Result Value Ref Range   Salicylate Lvl <7.0 (L) 7.0 - 30.0 mg/dL  Acetaminophen level  Result Value Ref Range   Acetaminophen (Tylenol), Serum <10 (L) 10 - 30 ug/mL  Ethanol  Result Value Ref Range   Alcohol, Ethyl (B) <10 <10 mg/dL  Urine rapid drug screen (hosp performed)  Result Value Ref Range   Opiates NONE DETECTED NONE DETECTED   Cocaine NONE DETECTED NONE DETECTED   Benzodiazepines NONE DETECTED NONE DETECTED   Amphetamines NONE DETECTED NONE DETECTED   Tetrahydrocannabinol POSITIVE (A) NONE DETECTED   Barbiturates NONE DETECTED NONE DETECTED  CBC WITH DIFFERENTIAL  Result Value Ref Range   WBC 11.8 (H) 4.0 - 10.5 K/uL   RBC 3.70 (L) 4.22 - 5.81 MIL/uL   Hemoglobin 10.0 (L) 13.0 - 17.0 g/dL   HCT 32.4 (L) 40.1 - 02.7 %   MCV 86.2 80.0 - 100.0 fL   MCH 27.0 26.0 - 34.0 pg   MCHC 31.3 30.0 - 36.0 g/dL   RDW 25.3 66.4 - 40.3 %   Platelets 348 150 - 400  K/uL   nRBC 0.0 0.0 - 0.2 %   Neutrophils Relative % 67 %   Neutro Abs 7.9 (H) 1.7 - 7.7 K/uL   Lymphocytes Relative 22 %   Lymphs Abs 2.5 0.7 -  4.0 K/uL   Monocytes Relative 8 %   Monocytes Absolute 1.0 0.1 - 1.0 K/uL   Eosinophils Relative 2 %   Eosinophils Absolute 0.2 0.0 - 0.5 K/uL   Basophils Relative 1 %   Basophils Absolute 0.1 0.0 - 0.1 K/uL   Immature Granulocytes 0 %   Abs Immature Granulocytes 0.02 0.00 - 0.07 K/uL  CBG monitoring, ED  Result Value Ref Range   Glucose-Capillary 96 70 - 99 mg/dL   No results found.  EKG EKG Interpretation Date/Time:  Monday December 11 2022 00:26:06 EST Ventricular Rate:  84 PR Interval:  143 QRS Duration:  93 QT Interval:  391 QTC Calculation: 463 R Axis:   72  Text Interpretation: Sinus rhythm Baseline wander in lead(s) II III aVF V4 Confirmed by Nicanor Alcon, Natashia Roseman ([id number]) on 12/11/2022 2:21:22 AM  Radiology No results found.  Procedures Procedures    Medications Ordered in ED Medications  potassium chloride SA (KLOR-CON M) CR tablet 40 mEq (has no administration in time range)  ondansetron (ZOFRAN) tablet 4 mg (has no administration in time range)  alum & mag hydroxide-simeth (MAALOX/MYLANTA) 200-200-20 MG/5ML suspension 30 mL (has no administration in time range)  albuterol (VENTOLIN HFA) 108 (90 Base) MCG/ACT inhaler 2 puff (has no administration in time range)  pantoprazole (PROTONIX) EC tablet 40 mg (has no administration in time range)    ED Course/ Medical Decision Making/ A&P                                 Medical Decision Making Patient who OD to commit suicide   Amount and/or Complexity of Data Reviewed Independent Historian: EMS    Details: See above  External Data Reviewed: notes.    Details: Previous notes reviewed  Labs: ordered.    Details: Negative tylenol and salicylate UDS only positive for THC, no opiates.  Normal sodium 139, potassium slight low 3.3, normal creatinine. Elevated white count  11.8, hemoglobin slight low 10, negative ETOH  Risk OTC drugs. Prescription drug management. Risk Details: Medically cleared by me for psychiatry, meds placed     Final Clinical Impression(s) / ED Diagnoses Final diagnoses:  Intentional overdose, initial encounter [facility])   The patient has been placed in psychiatric observation due to the need to provide a safe environment for the patient while obtaining psychiatric consultation and evaluation, as well as ongoing medical and medication management to treat the patient's condition.  The patient has not been placed under full IVC at this time.  Rx / DC Orders ED Discharge Orders     None         Alwaleed Obeso, MD 12/11/22 [phone]

## 2022-12-11 NOTE — ED Notes (Signed)
Informed that pt to be assessed after shift change by daytime provider for TTS intake.

## 2022-12-11 NOTE — ED Notes (Signed)
Patient has been alert this shift. Patient is medication compliant. Patient denied suicidal ideation at this time.  Patient denied homicidal ideation at this time.

## 2022-12-11 NOTE — ED Notes (Signed)
Call lab.   Will add on Lipase lab.

## 2022-12-11 NOTE — BH Assessment (Signed)
TTS attempt, patient currently in withdrawals. [place], RN, patient vomiting and in withdrawals. Patient will be seen be daytime provider.

## 2022-12-12 DIAGNOSIS — F332 Major depressive disorder, recurrent severe without psychotic features: Secondary | ICD-10-CM | POA: Diagnosis not present

## 2022-12-12 MED ORDER — HALOPERIDOL LACTATE 5 MG/ML IJ SOLN
5.0000 mg | Freq: Once | INTRAMUSCULAR | Status: AC
  Administered 2022-12-12: 5 mg via INTRAMUSCULAR
  Filled 2022-12-12: qty 1

## 2022-12-12 MED ORDER — AMLODIPINE BESYLATE 5 MG PO TABS
5.0000 mg | ORAL_TABLET | Freq: Once | ORAL | Status: AC
  Administered 2022-12-12: 5 mg via ORAL
  Filled 2022-12-12: qty 1

## 2022-12-12 MED ORDER — SERTRALINE HCL 50 MG PO TABS
50.0000 mg | ORAL_TABLET | Freq: Every day | ORAL | Status: DC
  Administered 2022-12-13: 50 mg via ORAL
  Filled 2022-12-12: qty 1

## 2022-12-12 MED ORDER — AMLODIPINE BESYLATE 5 MG PO TABS
10.0000 mg | ORAL_TABLET | Freq: Every day | ORAL | Status: DC
  Administered 2022-12-13: 10 mg via ORAL
  Filled 2022-12-12: qty 2

## 2022-12-12 MED ORDER — ACETAMINOPHEN 325 MG PO TABS
650.0000 mg | ORAL_TABLET | Freq: Once | ORAL | Status: AC
  Administered 2022-12-11: 650 mg via ORAL

## 2022-12-12 NOTE — Progress Notes (Signed)
Inpatient Behavioral Health Placement  Referral was sent to out of network providers:  [facility] Provider Address Phone Fax  [facility]  [address], [place] [address] [phone]  [facility] Patient Placement  [facility], [place] [phone]  [facility]  [address] [place] [address] [phone]  [facility]  [address] [place] [address] [phone]  [facility] [address]  [address] [place] [address] [phone]  [facility]  [address] [place] [address] [phone]  [facility]  [address] [place], [place] [address] [phone]  [facility]  [address] [place], [place] [address] [phone]  [facility]  [address], [place] [address] [phone]  [facility]  [address], [place] [address] [phone]  [facility]  [address] [place] [address] [phone]  [facility]  [phone] [place] [address] [phone]  [facility]  [address], [place] [address] [phone]  [facility]  [address] [place] [address] [phone]  [facility] EFAX  [address] [place], [place] [phone]  [facility]  [address] [place] [address] [phone]  [facility]  [address], [place] [address] [phone]  [facility]  [address] [place] [address] [phone]   [facility]  [address] [place] [address] [phone]  [facility]  [address] [place] [address] [phone]  [facility]  [place] [phone]  [facility]  [address] [place] [address] [phone]  [facility]  [address], [place] [address] [phone]  [facility]  [address], [place] [address] [phone]  [facility]  [address], [place] [address] [phone]  [facility]  [address] [place], [place] [address] [phone]  [facility]  [address] [place], [place] [address] [phone]  [facility]  [address] [place] [address] [phone]  [facility]  [address], [place] [address] [phone]  [facility]  [address] [place] [address] [phone]  [facility]  [address], [place] [address] [phone]  [facility] Inpatient [place]  [place] [phone]  [facility]  [place] [phone] --    Maryjean Ka, MSW, [facility] 12/12/2022 3:15 AM

## 2022-12-12 NOTE — ED Notes (Signed)
Patient alert, calm, and cooperative.  Patient medication compliant.

## 2022-12-12 NOTE — ED Notes (Signed)
Pt reports feelings of anxiety related to withdrawal from opiates CIWAl score is 6. Pt BP manually checked

## 2022-12-12 NOTE — ED Provider Notes (Addendum)
Emergency Medicine Observation Re-evaluation Note  Joseph Ruiz is a 47 y.o. male, seen on rounds today.  Pt initially presented to the ED for complaints of Suicide Attempt Currently, the patient is resting comfortably.  Physical Exam  BP (!) 166/88 (BP Location: Left Arm)   Pulse 74   Temp 98.7 F (37.1 C) (Oral)   Resp 18   Ht 5\' 9"  (1.753 m)   Wt 117.9 kg   SpO2 98%   BMI 38.40 kg/m  Physical Exam General: NAD Lungs: bilateral chest rise no tachypnea Psych: NAD  ED Course / MDM  EKG:EKG Interpretation Date/Time:  Monday December 11 2022 00:26:06 EST Ventricular Rate:  84 PR Interval:  143 QRS Duration:  93 QT Interval:  391 QTC Calculation: 463 R Axis:   72  Text Interpretation: Sinus rhythm Baseline wander in lead(s) II III aVF V4 Confirmed by Palumbo, April ([id number]) on 12/11/2022 2:21:22 AM  I have reviewed the labs performed to date as well as medications administered while in observation.  Recent changes in the last 24 hours include seen by psych recommends inpatient awaiting placement Patient agitated overnight requiring haldol.  Currently resting comfortably.  Plan  Current plan is for awaiting placement.      Melene Plan, DO 12/12/22 [phone]

## 2022-12-12 NOTE — Progress Notes (Signed)
[facility] Psych ED Progress Note  12/12/2022 1:14 PM Joseph Ruiz  MRN:  [id number]   Subjective:  Joseph Ruiz is a 47 y.o. male patient admitted with previous hx of Depression, substance abuse and accidental  Drug OD was brought in by EMS after he injected himself with excessive dose of Heroin as a suicide attempt.   Patient was seen this morning less anxious and calmer.  He admits that Opiate withdrawal symptoms is easing off.  Patient states he remains depressed and is happy he is back on Zoloft.  Patient is calm and cooperative but received Haldol early this morning IM but reason for Haldol is not given.  Patient was [facility] to [facility] to [facility] but that admission is changed to regular [facility] admission.  He has been faxed out to facilities seeking bed placement.  Patient meanwhile patient denies SI/HI/AVH. Principal Problem: Severe episode of recurrent major depressive disorder, without psychotic features (HCC) Diagnosis:  Principal Problem:   Severe episode of recurrent major depressive disorder, without psychotic features (HCC) Active Problems:   Opiate dependence [facility])   ED Assessment Time Calculation: Start Time: 1237 Stop Time: 1256 Total Time in Minutes (Assessment Completion): 19   Past Psychiatric History: See initial Psychiatry evaluation note   [place] Scale:  Flowsheet Row ED from 12/10/2022 in [facility] Department at [facility] ED to Hosp-Admission (Discharged) from 07/16/2022 in [place] LONG 4TH FLOOR PROGRESSIVE CARE AND UROLOGY ED from 04/04/2022 in [facility]  C-SSRS RISK CATEGORY High Risk No Risk No Risk       Past Medical History:  Past Medical History:  Diagnosis Date   COPD (chronic obstructive pulmonary disease) (HCC)    DDD (degenerative disc disease), lumbar    Diabetes mellitus without complication (HCC)    DVT (deep venous thrombosis) (HCC)    Hypertension    IV drug user    Heroin   Kidney stone      Past Surgical History:  Procedure Laterality Date   BACK SURGERY     CHOLECYSTECTOMY     GALLBLADDER SURGERY     KIDNEY SURGERY  2010   Family History:  Family History  Problem Relation Age of Onset   Hypertension Father    COPD Father    Diabetes Mother    Hypertension Mother    Hypertension Sister    Family Psychiatric  History: See initial Psychiatry evaluation note  Social History:  Social History   Substance and Sexual Activity  Alcohol Use No     Social History   Substance and Sexual Activity  Drug Use Yes   Types: IV   Comment: Heroin "$10 worth a day"    Social History   Socioeconomic History   Marital status: Single    Spouse name: Not on file   Number of children: Not on file   Years of education: Not on file   Highest education level: Not on file  Occupational History   Not on file  Tobacco Use   Smoking status: Every Day    Current packs/day: 1.00    Types: Cigarettes    Passive exposure: Current   Smokeless tobacco: Never  Vaping Use   Vaping status: Some Days  Substance and Sexual Activity   Alcohol use: No   Drug use: Yes    Types: IV    Comment: Heroin "$10 worth a day"   Sexual activity: Not Currently  Other Topics Concern   Not on file  Social History Narrative   Not on file   Social Determinants of Health   Financial Resource Strain: Not on File (02/07/2022)   Received from [employer] [employer]    Financial Resource Strain: 0  Food Insecurity: Not on File (10/19/2022)   Received from [employer] Insecurity    Food: 0  Transportation Needs: No Transportation Needs (07/16/2022)   PRAPARE - Administrator, Civil Service (Medical): No    Lack of Transportation (Non-Medical): No  Physical Activity: Not on File (02/07/2022)   Received from [place], [place]   Physical Activity    Physical Activity: 0  Stress: Not on File (02/07/2022)   Received from [facility] [place]   Stress    Stress: 0  Social  Connections: Not on File (10/07/2022)   Received from [employer]   Social Connections    Connectedness: 0    Sleep: Good  Appetite:  Fair  Current Medications: Current Facility-Administered Medications  Medication Dose Route Frequency Provider Last Rate Last Admin   acetaminophen (TYLENOL) tablet 650 mg  650 mg Oral Q6H PRN Alvira Monday, MD   650 mg at 12/11/22 1121   albuterol (VENTOLIN HFA) 108 (90 Base) MCG/ACT inhaler 2 puff  2 puff Inhalation Q4H PRN Palumbo, April, MD       alum & mag hydroxide-simeth (MAALOX/MYLANTA) 200-200-20 MG/5ML suspension 30 mL  30 mL Oral Q6H PRN Palumbo, April, MD   30 mL at 12/11/22 1120   [START ON 12/13/2022] amLODipine (NORVASC) tablet 10 mg  10 mg Oral Daily Melene Plan, DO       cloNIDine (CATAPRES) tablet 0.1 mg  0.1 mg Oral Daily Dalene Seltzer, Erin, MD   0.1 mg at 12/12/22 0912   gabapentin (NEURONTIN) capsule 300 mg  300 mg Oral TID Dahlia Byes C, NP   300 mg at 12/12/22 0912   ondansetron (ZOFRAN) tablet 4 mg  4 mg Oral Q8H PRN Palumbo, April, MD   4 mg at 12/11/22 1121   pantoprazole (PROTONIX) EC tablet 40 mg  40 mg Oral Daily Palumbo, April, MD   40 mg at 12/12/22 0912   [START ON 12/13/2022] sertraline (ZOLOFT) tablet 50 mg  50 mg Oral Daily Dahlia Byes C, NP       Current Outpatient Medications  Medication Sig Dispense Refill   cloNIDine (CATAPRES) 0.1 MG tablet Take 0.1 mg by mouth daily.     gabapentin (NEURONTIN) 300 MG capsule Take 300 mg by mouth 3 (three) times daily.     albuterol (VENTOLIN HFA) 108 (90 Base) MCG/ACT inhaler Inhale 2 puffs into the lungs every 4 (four) hours as needed for wheezing or shortness of breath. (Patient not taking: Reported on 07/16/2022)     alum & mag hydroxide-simeth (MAALOX/MYLANTA) 200-200-20 MG/5ML suspension Take 30 mLs by mouth every 4 (four) hours as needed for indigestion or heartburn. (Patient not taking: Reported on 12/11/2022) 355 mL 0   aspirin EC 81 MG tablet Take 1 tablet (81 mg total)  by mouth daily. (Patient not taking: Reported on 12/11/2022) 30 tablet 0   ondansetron (ZOFRAN-ODT) 4 MG disintegrating tablet Take 4 mg by mouth every 8 (eight) hours as needed for nausea. (Patient not taking: Reported on 07/16/2022)     pantoprazole (PROTONIX) 40 MG tablet Take 1 tablet (40 mg total) by mouth daily. (Patient not taking: Reported on 12/11/2022) 30 tablet 0    Lab Results:  Results for orders placed or performed during the hospital  encounter of 12/10/22 (from the past 48 hour(s))  CBG monitoring, ED     Status: None   Collection Time: 12/11/22 12:50 AM  Result Value Ref Range   Glucose-Capillary 96 70 - 99 mg/dL    Comment: Glucose reference range applies only to samples taken after fasting for at least 8 hours.  Urine rapid drug screen (hosp performed)     Status: Abnormal   Collection Time: 12/11/22  1:09 AM  Result Value Ref Range   Opiates NONE DETECTED NONE DETECTED   Cocaine NONE DETECTED NONE DETECTED   Benzodiazepines NONE DETECTED NONE DETECTED   Amphetamines NONE DETECTED NONE DETECTED   Tetrahydrocannabinol POSITIVE (A) NONE DETECTED   Barbiturates NONE DETECTED NONE DETECTED    Comment: (NOTE) DRUG SCREEN FOR MEDICAL PURPOSES ONLY.  IF CONFIRMATION IS NEEDED FOR ANY PURPOSE, NOTIFY LAB WITHIN 5 DAYS.  LOWEST DETECTABLE LIMITS FOR URINE DRUG SCREEN Drug Class                     Cutoff (ng/mL) Amphetamine and metabolites    1000 Barbiturate and metabolites    200 Benzodiazepine                 200 Opiates and metabolites        300 Cocaine and metabolites        300 THC                            50 Performed at [facility], [address] [place], [place] [address]   Comprehensive metabolic panel     Status: Abnormal   Collection Time: 12/11/22  1:44 AM  Result Value Ref Range   Sodium 139 135 - 145 mmol/L   Potassium 3.3 (L) 3.5 - 5.1 mmol/L   Chloride 106 98 - 111 mmol/L   CO2 25 22 - 32 mmol/L   Glucose, Bld 117 (H) 70  - 99 mg/dL    Comment: Glucose reference range applies only to samples taken after fasting for at least 8 hours.   BUN 22 (H) 6 - 20 mg/dL   Creatinine, Ser 1.66 0.61 - 1.24 mg/dL   Calcium 8.3 (L) 8.9 - 10.3 mg/dL   Total Protein 6.6 6.5 - 8.1 g/dL   Albumin 3.1 (L) 3.5 - 5.0 g/dL   AST 063 (H) 15 - 41 U/L   ALT 78 (H) 0 - 44 U/L   Alkaline Phosphatase 102 38 - 126 U/L   Total Bilirubin 0.2 <1.2 mg/dL   GFR, Estimated >01 >60 mL/min    Comment: (NOTE) Calculated using the CKD-EPI Creatinine Equation (2021)    Anion gap 8 5 - 15    Comment: Performed at [facility], [address], [place], [place] [address]  Salicylate level     Status: Abnormal   Collection Time: 12/11/22  1:44 AM  Result Value Ref Range   Salicylate Lvl <7.0 (L) 7.0 - 30.0 mg/dL    Comment: Performed at [facility], [address], [place], [place] [address]  Acetaminophen level     Status: Abnormal   Collection Time: 12/11/22  1:44 AM  Result Value Ref Range   Acetaminophen (Tylenol), Serum <10 (L) 10 - 30 ug/mL    Comment: (NOTE) Therapeutic concentrations vary significantly. A range of 10-30 ug/mL  may be an effective concentration for many patients. However, some  are best treated at  concentrations outside of this range. Acetaminophen concentrations >150 ug/mL at 4 hours after ingestion  and >50 ug/mL at 12 hours after ingestion are often associated with  toxic reactions.  Performed at [facility], [address], [place], [place] [address]   Ethanol     Status: None   Collection Time: 12/11/22  1:44 AM  Result Value Ref Range   Alcohol, Ethyl (B) <10 <10 mg/dL    Comment: (NOTE) Lowest detectable limit for serum alcohol is 10 mg/dL.  For medical purposes only. Performed at [facility], [address], [place], [place] [address]   CBC WITH DIFFERENTIAL     Status: Abnormal   Collection Time: 12/11/22  1:44 AM   Result Value Ref Range   WBC 11.8 (H) 4.0 - 10.5 K/uL   RBC 3.70 (L) 4.22 - 5.81 MIL/uL   Hemoglobin 10.0 (L) 13.0 - 17.0 g/dL   HCT 09.8 (L) 11.9 - 14.7 %   MCV 86.2 80.0 - 100.0 fL   MCH 27.0 26.0 - 34.0 pg   MCHC 31.3 30.0 - 36.0 g/dL   RDW 82.9 56.2 - 13.0 %   Platelets 348 150 - 400 K/uL   nRBC 0.0 0.0 - 0.2 %   Neutrophils Relative % 67 %   Neutro Abs 7.9 (H) 1.7 - 7.7 K/uL   Lymphocytes Relative 22 %   Lymphs Abs 2.5 0.7 - 4.0 K/uL   Monocytes Relative 8 %   Monocytes Absolute 1.0 0.1 - 1.0 K/uL   Eosinophils Relative 2 %   Eosinophils Absolute 0.2 0.0 - 0.5 K/uL   Basophils Relative 1 %   Basophils Absolute 0.1 0.0 - 0.1 K/uL   Immature Granulocytes 0 %   Abs Immature Granulocytes 0.02 0.00 - 0.07 K/uL    Comment: Performed at [facility], [address], [place], [place] [address]  Lipase, blood     Status: None   Collection Time: 12/11/22  1:44 AM  Result Value Ref Range   Lipase 40 11 - 51 U/L    Comment: Performed at [facility], [address] [place], [place] [address]    Blood Alcohol level:  Lab Results  Component Value Date   ETH <10 12/11/2022    Physical Findings:  CIWA:  CIWA-Ar Total: 6 COWS:  COWS Total Score: 12  Musculoskeletal: Strength & Muscle Tone: within normal limits Gait & Station: normal Patient leans: Front  Psychiatric Specialty Exam:  Presentation  General Appearance:  Casual  Eye Contact: Good  Speech: Clear and Coherent; Normal Rate  Speech Volume: Normal  Handedness: Right   Mood and Affect  Mood: Depressed  Affect: Congruent; Depressed   Thought Process  Thought Processes: Coherent; Goal Directed  Descriptions of Associations:Intact  Orientation:Full (Time, Place and Person)  Thought Content:Logical  History of Schizophrenia/Schizoaffective disorder:No data recorded Duration of Psychotic Symptoms:No data recorded Hallucinations:Hallucinations:  None  Ideas of Reference:None  Suicidal Thoughts:Suicidal Thoughts: No  Homicidal Thoughts:Homicidal Thoughts: No   Sensorium  Memory: Immediate Good; Recent Good; Remote Good  Judgment: Good  Insight: Good   Executive Functions  Concentration: Good  Attention Span: Good  Recall: Good  Fund of Knowledge: Good  Language: Good   Psychomotor Activity  Psychomotor Activity: Psychomotor Activity: Normal   Assets  Assets: Communication Skills; Desire for Improvement; Housing; Social Support   Sleep  Sleep: Sleep: Good    Physical Exam: Physical Exam Vitals and nursing note reviewed.  Constitutional:  Appearance: Normal appearance.  HENT:     Nose: Nose normal.  Cardiovascular:     Rate and Rhythm: Normal rate and regular rhythm.  Pulmonary:     Effort: Pulmonary effort is normal.  Musculoskeletal:        General: Normal range of motion.  Skin:    General: Skin is dry.  Neurological:     Mental Status: He is alert and oriented to person, place, and time.  Psychiatric:        Attention and Perception: Attention and perception normal.        Mood and Affect: Mood is anxious and depressed.        Speech: Speech normal.        Behavior: Behavior normal. Behavior is cooperative.        Thought Content: Thought content normal.    Review of Systems  Constitutional: Negative.   HENT: Negative.    Eyes: Negative.   Respiratory: Negative.    Cardiovascular: Negative.   Gastrointestinal: Negative.   Genitourinary: Negative.   Musculoskeletal: Negative.   Skin: Negative.   Neurological: Negative.   Endo/Heme/Allergies: Negative.   Psychiatric/Behavioral:  Positive for depression and substance abuse. The patient is nervous/anxious.    Blood pressure (!) 142/82, pulse 76, temperature 98.1 F (36.7 C), temperature source Oral, resp. rate 18, height 5\' 9"  (1.753 m), weight 117.9 kg, SpO2 99%. Body mass index is 38.4 kg/m.   Medical  Decision Making: Patient continues to require inpatient Psychiatry hospitalization for treatment of Depression and management of Opiate withdrawal symptoms.  Patient remains calm and cooperative.  We will continue to seek bed placement by faxing out records.  We will increase Sertraline to 50 mg daily.  Earney Navy, NP-PMHNP-BC 12/12/2022, 1:14 PM

## 2022-12-12 NOTE — ED Notes (Signed)
Patient has been alert this shift. Cooperative .  Quiet. Guarded. No suicidal ideation noted. No homicidal ideation noted.  Medication compliant.

## 2022-12-13 DIAGNOSIS — F332 Major depressive disorder, recurrent severe without psychotic features: Secondary | ICD-10-CM | POA: Diagnosis not present

## 2022-12-13 NOTE — ED Provider Notes (Signed)
  Physical Exam  BP (!) 192/108 (BP Location: Right Arm)   Pulse 69   Temp 98.8 F (37.1 C) (Oral)   Resp 20   Ht 5\' 9"  (1.753 m)   Wt 117.9 kg   SpO2 98%   BMI 38.40 kg/m   Physical Exam  Procedures  Procedures  ED Course / MDM    Medical Decision Making Amount and/or Complexity of Data Reviewed Labs: ordered.  Risk OTC drugs. Prescription drug management.   Resting comfortably.  Pending transfer to [facility].  EMTALA documentation done.       Benjiman Core, MD 12/13/22 [phone]

## 2022-12-13 NOTE — ED Notes (Signed)
Safe transport called 

## 2022-12-13 NOTE — Progress Notes (Signed)
Pt was accepted to [facility] 12/13/2022; Bed Assignment Main [facility] Fax Number: [phone] (Adult)  Pt meets inpatient criteria per Earney Navy, NP-PMHNP-BC   Attending Physician will be Dr. Loni Beckwith   Report can be called to:[phone]-Pager number, please leave a returned phone number to receive a phone call back.   Pt can arrive after 9:00am   Care Team notified: Sherin Quarry [place],LCSWA  Maryjean Ka, MSW, [facility] 12/13/2022 2:09 AM

## 2022-12-13 NOTE — Progress Notes (Addendum)
LCSW Progress Note  [id number]   NARESH TRIVINO  12/13/2022  1:40 AM    Inpatient Behavioral Health Placement  Pt meets inpatient criteria per Earney Navy, NP-PMHNP-BC. There are no available beds at [facility] per [facility] Kenisha Herbin,RN. Referral was sent to the following facilities;  Destination  Service Provider Address Phone Fax  [facility]  [address], [place] [address] [phone]  [facility] Patient Placement  [facility], [place] [phone]  [facility]  [address] [place] [address] [phone]  [facility]  [address] [place] [address] [phone]  [facility] [address]  [address], [place] [address] [phone]  [facility]  [address] [place] [address] [phone]  [facility]  [address] [place], [place] [address] [phone]  [facility]  [address] [place], [place] [address] [phone]  [facility]  [address] [place] [address] [phone]  [facility]  [address] [place] [address] [phone]  [facility]  [address] [place] [address] [phone]  [facility]  [phone] [address] [place] [address] [phone]  [facility]  [address] [place] [address] [phone]  [facility]  [address], [place] [address] [phone]  [facility]FAX  [address] [place], [place] [phone]  [facility]  [address], [place] [address]  [phone]  [facility]  [address] [place], [place] [address] [phone]  [facility]  [address] [place] [address] [phone]  [facility]  [address] [place] [address] [phone]  [facility]  [address], [place] [address] [phone]  [facility]  [place] [phone]  [facility]  [address] [place] [address] [phone]  [facility]  [address] [place] [address] [phone]  [facility]  [address] [place] [address] [phone]  [facility]  [address], [place] [address] [phone]  [facility]  [address] [place], [place] [address] [phone]  [facility]  [address] [place], [place] [address] [phone]  [facility]  [address] [place] [address] [phone]  [facility]  [address], [place] [address] [phone]  [facility]  [address] [place] [address] [phone]  [facility]  [address] [place] [address] [phone]  [facility] Inpatient [place]  [place] [phone]  [facility]  [address], [place] [address] [phone]  [facility]  [place] [phone]     Situation ongoing,  CSW will follow up.    Maryjean Ka, MSW, LCSWA 12/13/2022 1:40 AM

## 2022-12-13 NOTE — ED Notes (Signed)
Pt was accepted to [facility] 12/13/2022; Bed Assignment Main [facility] Fax Number: [phone] (Adult) Pt meets inpatient criteria per Earney Navy, NP-PMHNP-BC  Attending Physician will be Dr. Loni Beckwith  Report can be called to:[phone]-Pager number, please leave a returned phone number to receive a phone call back.  Pt can arrive after 9:00am

## 2022-12-13 NOTE — ED Notes (Signed)
Left callback for report

## 2022-12-13 NOTE — ED Notes (Signed)
Pt was accepted to [facility] 12/13/2022; Bed Assignment Main [facility] Fax Number: [phone] (Adult)  Pt meets inpatient criteria per Earney Navy, NP-PMHNP-BC   Attending Physician will be Dr. Loni Beckwith   Report can be called to:[phone]-Pager number, please leave a returned phone number to receive a phone call back.   Pt can arrive after 9:00am   Care Team notified: Sherin Quarry [place],LCSWA

## 2023-01-16 DIAGNOSIS — I959 Hypotension, unspecified: Secondary | ICD-10-CM | POA: Diagnosis not present

## 2023-01-30 ENCOUNTER — Encounter: Payer: Self-pay | Admitting: Specialist

## 2024-01-15 DIAGNOSIS — R079 Chest pain, unspecified: Secondary | ICD-10-CM | POA: Diagnosis not present
